# Patient Record
Sex: Male | Born: 1970 | Race: Black or African American | Hispanic: No | Marital: Married | Smoking: Never smoker
Health system: Southern US, Community
[De-identification: ages and names within clinical notes are randomized; demographics above are authoritative.]

## PROBLEM LIST (undated history)

## (undated) DIAGNOSIS — E291 Testicular hypofunction: Secondary | ICD-10-CM

## (undated) DIAGNOSIS — T7840XA Allergy, unspecified, initial encounter: Secondary | ICD-10-CM

## (undated) DIAGNOSIS — K219 Gastro-esophageal reflux disease without esophagitis: Secondary | ICD-10-CM

## (undated) DIAGNOSIS — G4733 Obstructive sleep apnea (adult) (pediatric): Secondary | ICD-10-CM

## (undated) DIAGNOSIS — J302 Other seasonal allergic rhinitis: Secondary | ICD-10-CM

## (undated) DIAGNOSIS — R12 Heartburn: Secondary | ICD-10-CM

## (undated) DIAGNOSIS — G4730 Sleep apnea, unspecified: Secondary | ICD-10-CM

## (undated) HISTORY — DX: Heartburn: R12

## (undated) HISTORY — DX: Gastro-esophageal reflux disease without esophagitis: K21.9

## (undated) HISTORY — PX: UPPER GASTROINTESTINAL ENDOSCOPY: SHX188

## (undated) HISTORY — DX: Sleep apnea, unspecified: G47.30

## (undated) HISTORY — DX: Allergy, unspecified, initial encounter: T78.40XA

## (undated) HISTORY — DX: Testicular hypofunction: E29.1

## (undated) HISTORY — PX: NO PAST SURGERIES: SHX2092

## (undated) HISTORY — PX: WISDOM TOOTH EXTRACTION: SHX21

## (undated) HISTORY — DX: Other seasonal allergic rhinitis: J30.2

## (undated) HISTORY — DX: Obstructive sleep apnea (adult) (pediatric): G47.33

---

## 2006-03-06 ENCOUNTER — Ambulatory Visit: Payer: Self-pay | Admitting: Family Medicine

## 2007-12-10 ENCOUNTER — Ambulatory Visit: Payer: Self-pay | Admitting: Internal Medicine

## 2007-12-10 DIAGNOSIS — Z8711 Personal history of peptic ulcer disease: Secondary | ICD-10-CM

## 2008-02-17 ENCOUNTER — Ambulatory Visit: Payer: Self-pay | Admitting: Internal Medicine

## 2008-02-22 LAB — CONVERTED CEMR LAB
ALT: 29 units/L (ref 0–53)
AST: 23 units/L (ref 0–37)
Alkaline Phosphatase: 53 units/L (ref 39–117)
Bilirubin, Direct: 0.2 mg/dL (ref 0.0–0.3)
CO2: 30 meq/L (ref 19–32)
Glucose, Bld: 92 mg/dL (ref 70–99)
HDL: 47.4 mg/dL (ref >39.0)
Hemoglobin: 15.8 g/dL (ref 13.0–17.0)
LDL Cholesterol: 86 mg/dL (ref 0–99)
Lymphocytes Relative: 26.3 % (ref 12.0–46.0)
Monocytes Relative: 6.5 % (ref 3.0–12.0)
Platelets: 153 10*3/uL (ref 150–400)
Potassium: 4 meq/L (ref 3.5–5.1)
RDW: 12.6 % (ref 11.5–14.6)
Sodium: 140 meq/L (ref 135–145)
Total CHOL/HDL Ratio: 3
Total Protein: 7.2 g/dL (ref 6.0–8.3)
Triglycerides: 46 mg/dL (ref 0–149)
WBC: 3.9 10*3/uL — ABNORMAL LOW (ref 4.5–10.5)

## 2008-06-20 ENCOUNTER — Ambulatory Visit: Payer: Self-pay | Admitting: Internal Medicine

## 2008-06-20 DIAGNOSIS — N410 Acute prostatitis: Secondary | ICD-10-CM

## 2008-06-20 LAB — CONVERTED CEMR LAB
Bilirubin Urine: NEGATIVE
Nitrite: NEGATIVE
Protein, U semiquant: 30
Urobilinogen, UA: 0.2
pH: 7

## 2008-08-16 ENCOUNTER — Telehealth (INDEPENDENT_AMBULATORY_CARE_PROVIDER_SITE_OTHER): Payer: Self-pay | Admitting: *Deleted

## 2008-08-18 ENCOUNTER — Ambulatory Visit: Payer: Self-pay | Admitting: Internal Medicine

## 2008-08-18 DIAGNOSIS — K219 Gastro-esophageal reflux disease without esophagitis: Secondary | ICD-10-CM

## 2008-08-18 LAB — CONVERTED CEMR LAB
Bilirubin Urine: NEGATIVE
Glucose, Urine, Semiquant: NEGATIVE
Protein, U semiquant: NEGATIVE
Specific Gravity, Urine: 1.02
WBC Urine, dipstick: NEGATIVE
pH: 6

## 2009-04-03 ENCOUNTER — Telehealth (INDEPENDENT_AMBULATORY_CARE_PROVIDER_SITE_OTHER): Payer: Self-pay | Admitting: *Deleted

## 2009-04-20 ENCOUNTER — Ambulatory Visit: Payer: Self-pay | Admitting: Internal Medicine

## 2009-04-24 LAB — CONVERTED CEMR LAB
BUN: 15 mg/dL (ref 6–23)
Basophils Absolute: 0 10*3/uL (ref 0.0–0.1)
CO2: 29 meq/L (ref 19–32)
Chloride: 104 meq/L (ref 96–112)
Eosinophils Absolute: 0.3 10*3/uL (ref 0.0–0.7)
Glucose, Bld: 91 mg/dL (ref 70–99)
HDL: 45.5 mg/dL (ref >39.00)
Hemoglobin: 16.3 g/dL (ref 13.0–17.0)
Lymphocytes Relative: 27.9 % (ref 12.0–46.0)
MCHC: 34 g/dL (ref 30.0–36.0)
Monocytes Relative: 5.2 % (ref 3.0–12.0)
Neutro Abs: 2.6 10*3/uL (ref 1.4–7.7)
Neutrophils Relative %: 58.9 % (ref 43.0–77.0)
Potassium: 4.4 meq/L (ref 3.5–5.1)
RBC: 5.32 M/uL (ref 4.22–5.81)
RDW: 12.3 % (ref 11.5–14.6)
Sodium: 140 meq/L (ref 135–145)
Triglycerides: 73 mg/dL (ref 0.0–149.0)
VLDL: 14.6 mg/dL (ref 0.0–40.0)

## 2009-06-08 ENCOUNTER — Ambulatory Visit: Payer: Self-pay | Admitting: Internal Medicine

## 2010-09-10 ENCOUNTER — Other Ambulatory Visit: Payer: Self-pay | Admitting: Internal Medicine

## 2010-09-10 ENCOUNTER — Ambulatory Visit (INDEPENDENT_AMBULATORY_CARE_PROVIDER_SITE_OTHER): Payer: BC Managed Care – PPO | Admitting: Internal Medicine

## 2010-09-10 ENCOUNTER — Ambulatory Visit (INDEPENDENT_AMBULATORY_CARE_PROVIDER_SITE_OTHER)
Admission: RE | Admit: 2010-09-10 | Discharge: 2010-09-10 | Disposition: A | Discharge status: Home / Self Care | Payer: BC Managed Care – PPO | Source: Ambulatory Visit | Attending: Internal Medicine | Admitting: Internal Medicine

## 2010-09-10 ENCOUNTER — Encounter: Payer: Self-pay | Admitting: Internal Medicine

## 2010-09-10 DIAGNOSIS — R0789 Other chest pain: Secondary | ICD-10-CM

## 2010-09-10 DIAGNOSIS — K219 Gastro-esophageal reflux disease without esophagitis: Secondary | ICD-10-CM

## 2010-09-11 ENCOUNTER — Encounter: Payer: Self-pay | Admitting: Gastroenterology

## 2010-09-17 NOTE — Assessment & Plan Note (Signed)
Summary: Stomach pain/ulcer flare up/cdj   Vital Signs:  Patient profile:   40 year old male Height:      70.75 inches Weight:      246.50 pounds BMI:     34.75 Pulse rate:   83 / minute Pulse rhythm:   regular BP sitting:   124 / 78  (left arm) Cuff size:   large  Vitals Entered By: Army Fossa CMA (September 10, 2010 1:10 PM) CC: Pt here to discuss pain from his Ulcer Comments Started yesterday [facility] [address] [facility]   History of Present Illness: developed a steady , shart pain at the R antero-lateral  chest wall yesterday while at the dentist no change w/ p.o. intake, worse w/ deep breaths  ,no radiation   No associated symptoms.  historically , this type of pain is associated with peptic ulcer disease. He has taken Prevacid x 1 since yesterday. No change in pain  ROS  denies fevers No cough , no shortness or breath but again has increased pain when he takes a deep breath  denies nausea, vomiting, diarrhea or blood in the stools Denies any leg swelling or pain in his calves, no recent airplane trip   Current Medications (verified): 1)  Prevacid 24hr 15 Mg Cpdr (Lansoprazole) .... As Needed  Allergies (verified): No Known Drug Allergies  Past History:  Past Medical History: Reviewed history from 08/18/2008 and no changes required.  GASTRIC ULCER, HX OF...documented by EGD --in the 1990s   GERD  Past Surgical History: Reviewed history from 06/20/2008 and no changes required. none  Family History: Reviewed history from 12/10/2007 and no changes required. CAD - no HTN - no DM - no stroke - no colon Ca - no prostate Ca - no M & F living -- good health  Social History: Reviewed history from 06/08/2009 and no changes required. Married 1  daughter born 2002 aprox Never Smoked Alcohol use-yes Drug use-no Occupation:   happy w/his job finished his  PhD exercisin 1-2/week diet--good , healthy   Physical Exam  General:  alert, well-developed, and  well-nourished.   no apparent distress Eyes:   not pale or jaundice Neck:   no lymphadenopathies, no crepitus Chest Wall:   slightly tender and they anterolateral chest wall , no rash or crepitus Lungs:  normal respiratory effort, no intercostal retractions, no accessory muscle use, and normal breath sounds.   no increased work of breathing Heart:  normal rate, regular rhythm, no murmur, and no gallop.   Abdomen:  soft, non-tender, no distention, no masses, no guarding, no rigidity, no hepatomegaly, and no splenomegaly.   specifically, no epigastric or right upper quadrant tenderness Extremities:   no edema,  calves symmetric   Impression & Recommendations:  Problem # 1:  CHEST PAIN, ATYPICAL (EAV-409.81) Assessment New 40 year old male, nonsmoker, no history of hypertension or high cholesterol  no family history of CAD presents with atypical chest pain. Chest   is likely pleuritic by description . On the  patient's own experience, this type of pain is usually associated with peptic ulcer disease. Plan: Chest x-ray EKG--no acute changes, no changes c/w  pericarditis continue with Prevacid ER if symptoms increase  Further workup if symptoms continue  Orders: T-2 View CXR (71020TC) EKG w/ Interpretation (93000)  Problem # 2:  GERD (ICD-530.81)  would like a GI referal , patient is concern about his h/o PUD and GERD symptoms , EGD?  His updated medication list for this problem includes:  Prevacid 24hr 15 Mg Cpdr (Lansoprazole) .Marland Kitchen... As needed  Orders: Gastroenterology Referral (GI)  Complete Medication List: 1)  Prevacid 24hr 15 Mg Cpdr (Lansoprazole) .... As needed  Patient Instructions: 1)  prevacid daily 2)  call if symptoms severe or no better in few days 3)  call if fever, short of breath, swelling on the legs    Orders Added: 1)  T-2 View CXR [71020TC] 2)  EKG w/ Interpretation [93000] 3)  Est. Patient Level IV [11914] 4)  Gastroenterology Referral [GI]

## 2010-09-17 NOTE — Letter (Signed)
Summary: New Patient letter  [facility]  [address] [place], [place] [address]   Phone: [phone]  Fax: [phone]       09/11/2010 MRN: [id number]  [facility] [address] [place], [place]  [address]  [place]  Dear Mr. Tyrone Freeman,  Welcome to the Gastroenterology Division at [facility].    You are scheduled to see Dr.  Jarold Motto on 10-15-10 at 1:30pm on the 3rd floor at [facility], [address]  We ask that you try to arrive at our office 15 minutes prior to your appointment time to allow for check-in.  We would like you to complete the enclosed self-administered evaluation form prior to your visit and bring it with you on the day of your appointment.  We will review it with you.  Also, please bring a complete list of all your medications or, if you prefer, bring the medication bottles and we will list them.  Please bring your insurance card so that we may make a copy of it.  If your insurance requires a referral to see a specialist, please bring your referral form from your primary care physician.  Co-payments are due at the time of your visit and may be paid by cash, check or credit card.     Your office visit will consist of a consult with your physician (includes a physical exam), any laboratory testing he/she may order, scheduling of any necessary diagnostic testing (e.g. x-ray, ultrasound, CT-scan), and scheduling of a procedure (e.g. Endoscopy, Colonoscopy) if required.  Please allow enough time on your schedule to allow for any/all of these possibilities.    If you cannot keep your appointment, please call [phone] to cancel or reschedule prior to your appointment date.  This allows [place] the opportunity to schedule an appointment for another patient in need of care.  If you do not cancel or reschedule by 5 p.m. the business day prior to your appointment date, you will be charged a $50.00 late cancellation/no-show fee.    Thank you for choosing  [facility] for your medical needs.  We appreciate the opportunity to care for you.  Please visit [place] at our website  to learn more about our practice.                     Sincerely,                                                             The Gastroenterology Division

## 2010-10-15 ENCOUNTER — Ambulatory Visit: Payer: BC Managed Care – PPO | Admitting: Gastroenterology

## 2011-06-19 ENCOUNTER — Encounter: Payer: Self-pay | Admitting: Internal Medicine

## 2011-06-19 ENCOUNTER — Ambulatory Visit (INDEPENDENT_AMBULATORY_CARE_PROVIDER_SITE_OTHER): Payer: BC Managed Care – PPO | Admitting: Internal Medicine

## 2011-06-19 VITALS — BP 110/82 | HR 65 | Temp 98.3°F | Ht 71.5 in | Wt 252.0 lb

## 2011-06-19 DIAGNOSIS — R39198 Other difficulties with micturition: Secondary | ICD-10-CM

## 2011-06-19 DIAGNOSIS — R300 Dysuria: Secondary | ICD-10-CM | POA: Insufficient documentation

## 2011-06-19 DIAGNOSIS — R3989 Other symptoms and signs involving the genitourinary system: Secondary | ICD-10-CM

## 2011-06-19 LAB — POCT URINALYSIS DIPSTICK
Bilirubin, UA: NEGATIVE
Ketones, UA: NEGATIVE
Protein, UA: NEGATIVE
Spec Grav, UA: 1.015
pH, UA: 5

## 2011-06-19 MED ORDER — CIPROFLOXACIN HCL 500 MG PO TABS: 500.0000 mg | ORAL_TABLET | Freq: Two times a day (BID) | ORAL | Status: AC

## 2011-06-19 NOTE — Progress Notes (Signed)
  Subjective:    Patient ID: Tyrone Freeman, male    DOB: 1970/09/03, 40 y.o.   MRN: [id number]  HPI Sx started 2 days ago: dysuria w/o other sx such as frecuency or difficulty urinating. Has not taken any meds, no h/o UTIs or STDs. "drinking more sodas than usual"   Past Medical History: GASTRIC ULCER, HX OF...documented by EGD --in the 1990s   GERD  Past Surgical History: none  Family History: CAD - no HTN - no DM - no stroke - no colon Ca - no prostate Ca - no M & F living -- good health  Social History: Married, 1  daughter born 2002 aprox Never Smoked Alcohol use-yes Drug use-no Occupation:  Warehouse manager, has a  PhD    Review of Systems No F/C No N V D No genital rash, no  high risk behavior, no penile d/c. No flank pain    Objective:   Physical Exam  Constitutional: He is oriented to person, place, and time. He appears well-developed. No distress.  Abdominal: Soft. He exhibits no distension. There is no tenderness. There is no rebound and no guarding.       No CVA tenderness   Genitourinary:       Genital-- normal to inspection and palpation, no genital rash or ulcer, testicles normal. DRE: quite limited , hard to reach the prostate but it was not tender   Musculoskeletal: He exhibits no edema.  Neurological: He is alert and oriented to person, place, and time.  Skin: Skin is warm and dry. He is not diaphoretic.  Psychiatric: He has a normal mood and affect. His behavior is normal. Judgment and thought content normal.      Assessment & Plan:

## 2011-06-19 NOTE — Patient Instructions (Signed)
Drink plenty of fluids cipro as prescribed  Call if you get worse or no better in few days

## 2011-06-19 NOTE — Assessment & Plan Note (Signed)
prssent w/ dysuria, ROS essentially negative, exam normal. On chart review, in 2009 had symilar sx Plan: Udip neg UCX, G&C Empiric cipro for now

## 2011-06-20 LAB — GC/CHLAMYDIA PROBE AMP, URINE
Chlamydia, Swab/Urine, PCR: NEGATIVE
GC Probe Amp, Urine: NEGATIVE

## 2011-06-21 LAB — URINE CULTURE: Colony Count: NO GROWTH

## 2012-05-21 ENCOUNTER — Ambulatory Visit (INDEPENDENT_AMBULATORY_CARE_PROVIDER_SITE_OTHER): Payer: BC Managed Care – PPO | Admitting: Internal Medicine

## 2012-05-21 VITALS — BP 118/82 | HR 66 | Temp 98.1°F | Wt 252.0 lb

## 2012-05-21 DIAGNOSIS — J309 Allergic rhinitis, unspecified: Secondary | ICD-10-CM

## 2012-05-21 DIAGNOSIS — J302 Other seasonal allergic rhinitis: Secondary | ICD-10-CM

## 2012-05-21 DIAGNOSIS — R10A0 Flank pain, unspecified side: Secondary | ICD-10-CM

## 2012-05-21 DIAGNOSIS — R109 Unspecified abdominal pain: Secondary | ICD-10-CM

## 2012-05-21 MED ORDER — SULFAMETHOXAZOLE-TRIMETHOPRIM 800-160 MG PO TABS: 1.0000 | ORAL_TABLET | Freq: Two times a day (BID) | ORAL | Status: DC

## 2012-05-21 MED ORDER — FLUTICASONE PROPIONATE 50 MCG/ACT NA SUSP: 2.0000 | Freq: Every day | NASAL | Status: DC

## 2012-05-21 NOTE — Progress Notes (Signed)
  Subjective:    Patient ID: Tyrone Freeman, male    DOB: 07-26-1970, 41 y.o.   MRN: [id number]  HPI Acute visit Several weeks history of upper respiratory symptoms: Nasal congestion, worse in the morning and late at night, cough with yellow sputum. He has used Nasonex, Advil, Sudafed. At this point the cough is resolved, nasal congestion persist. Patient thinks it may be allergies.  Past Medical History  Diagnosis Date  . Gastric ulcer   . Esophageal reflux   . Seasonal allergies 05/23/2012   No past surgical history on file.    Review of Systems Admits to sneezing on and off throughout the day, some clear nasal discharge   as well. Occasional sore throat, no ear ache. Denies itchy or watery eyes.     Objective:   Physical Exam General -- alert, well-developed, and overweight appearing. No apparent distress.  HEENT -- TMs slt bulge but no red, throat w/o redness, face symmetric and not tender to palpation, nose slt  Congested  Lungs -- normal respiratory effort, no intercostal retractions, no accessory muscle use, and normal breath sounds.   Heart-- normal rate, regular rhythm, no murmur, and no gallop.   Psych-- Cognition and judgment appear intact. Alert and cooperative with normal attention span and concentration.  not anxious appearing and not depressed appearing.       Assessment & Plan:

## 2012-05-21 NOTE — Patient Instructions (Addendum)
Take Flonase consistent every night for at least 4-6 weeks Takes Allegra twice a day also consistently If not improving in the next few weeks let me know --- Schedule a checkup at your convenience

## 2012-05-23 ENCOUNTER — Encounter: Payer: Self-pay | Admitting: Internal Medicine

## 2012-05-23 DIAGNOSIS — J302 Other seasonal allergic rhinitis: Secondary | ICD-10-CM

## 2012-05-23 HISTORY — DX: Other seasonal allergic rhinitis: J30.2

## 2012-05-23 NOTE — Assessment & Plan Note (Signed)
Likely seasonal allergies, in the past Zyrtec and Claritin didn't work. Plan: Allegra, consistent use of Flonase, see instructions

## 2012-07-19 ENCOUNTER — Ambulatory Visit (INDEPENDENT_AMBULATORY_CARE_PROVIDER_SITE_OTHER): Payer: BC Managed Care – PPO | Admitting: Internal Medicine

## 2012-07-19 ENCOUNTER — Encounter: Payer: Self-pay | Admitting: Internal Medicine

## 2012-07-19 VITALS — BP 122/80 | HR 94 | Temp 99.0°F | Wt 251.0 lb

## 2012-07-19 DIAGNOSIS — J302 Other seasonal allergic rhinitis: Secondary | ICD-10-CM

## 2012-07-19 DIAGNOSIS — E291 Testicular hypofunction: Secondary | ICD-10-CM

## 2012-07-19 DIAGNOSIS — J309 Allergic rhinitis, unspecified: Secondary | ICD-10-CM

## 2012-07-19 DIAGNOSIS — N529 Male erectile dysfunction, unspecified: Secondary | ICD-10-CM

## 2012-07-19 MED ORDER — FLUTICASONE PROPIONATE 50 MCG/ACT NA SUSP: 2.0000 | Freq: Every day | NASAL | Status: DC

## 2012-07-19 NOTE — Progress Notes (Signed)
  Subjective:    Patient ID: Emmanuelle Coxe, male    DOB: [birth year], 42 y.o.   MRN: [id number]  HPI ROV Allergies, not well controlled, despite medications; continue with runny nose, feels ears are stopped up. See assessment and plan.  Also, for the last 2 or 3 weeks has noted a decrease in his spontaneous erections and decrease in the quality of erections. Check testosterone?  Past Medical History  Diagnosis Date  . Gastric ulcer   . Esophageal reflux   . Seasonal allergies 05/23/2012   Past Surgical History  Procedure Date  . No past surgeries      Review of Systems At this point he denies any postnasal dripping, itchy eyes. He does have itchy nose and sneezing quite a bit. Denies anxiety, depression; relationship with wife is excellent. No recent changes in his weight. He remains active, denies claudication     Objective:   Physical Exam  General -- alert, well-developed HEENT -- TMs normal, throat w/o redness, face symmetric and not tender to palpation, nose is slightly congested  Lungs -- normal respiratory effort, no intercostal retractions, no accessory muscle use, and normal breath sounds.   Heart-- normal rate, regular rhythm, no murmur, and no gallop.   Extremities-- no pretibial edema bilaterally; normal femoral pulses bilaterally Neurologic-- alert & oriented X3 and strength normal in all extremities. Psych-- Cognition and judgment appear intact. Alert and cooperative with normal attention span and concentration.  not anxious appearing and not depressed appearing.      Assessment & Plan:

## 2012-07-19 NOTE — Assessment & Plan Note (Signed)
Since the last time I saw him, he used Flonase in the work very well for him but he didn't like to take it long term. Currently taking Allegra D. OTC every day. Plan: Continue with Allegra but without the "D" , explained patient that is not appropriate for  long-term use . Explained that he may have a transient increase in symptoms since he is stopping decongestants. Restart Flonase, that is safe for long-term. If not completely satisfied with treatment he will call for a referral

## 2012-07-19 NOTE — Assessment & Plan Note (Signed)
Decreased erections in the last couple of weeks, unclear etiology, vascular exam normal. He request a testosterone checked, will do

## 2012-07-20 LAB — TESTOSTERONE, FREE, TOTAL, SHBG
Testosterone, Free: 46.7 pg/mL — ABNORMAL LOW (ref 47.0–244.0)
Testosterone-% Free: 2.9 % (ref 1.6–2.9)
Testosterone: 161.82 ng/dL — ABNORMAL LOW (ref 300–890)

## 2012-07-26 ENCOUNTER — Telehealth: Payer: Self-pay | Admitting: *Deleted

## 2012-07-26 DIAGNOSIS — E291 Testicular hypofunction: Secondary | ICD-10-CM

## 2012-07-26 NOTE — Telephone Encounter (Signed)
Discussed with pt, he is coming in the morning to have his labs redrawn. Orders entered.

## 2012-07-26 NOTE — Telephone Encounter (Signed)
Testosterone was indeed low, we need to check other labs; we tried to contact him but couldn't. Please arrange FSH, LH, prolactin --- dx hypogonadism Test to be done in the morning

## 2012-07-26 NOTE — Telephone Encounter (Signed)
Patient called triage inquiring about test results from recent visit. Patients Testosterone level low at 161.80 with the Free Testosterone 46.7, please advise and I will contact patient. Thanks

## 2012-07-27 ENCOUNTER — Other Ambulatory Visit (INDEPENDENT_AMBULATORY_CARE_PROVIDER_SITE_OTHER): Payer: BC Managed Care – PPO

## 2012-07-27 DIAGNOSIS — E291 Testicular hypofunction: Secondary | ICD-10-CM

## 2012-07-27 LAB — FOLLICLE STIMULATING HORMONE: FSH: 4.8 m[IU]/mL (ref 1.4–18.1)

## 2012-07-29 NOTE — Addendum Note (Signed)
Addended by: Edwena Felty T on: 07/29/2012 03:11 PM   Modules accepted: Orders

## 2012-08-04 ENCOUNTER — Ambulatory Visit (INDEPENDENT_AMBULATORY_CARE_PROVIDER_SITE_OTHER): Payer: BC Managed Care – PPO | Admitting: Endocrinology

## 2012-08-04 VITALS — BP 130/78 | HR 90 | Wt 257.0 lb

## 2012-08-04 DIAGNOSIS — E291 Testicular hypofunction: Secondary | ICD-10-CM | POA: Insufficient documentation

## 2012-08-04 DIAGNOSIS — Z0000 Encounter for general adult medical examination without abnormal findings: Secondary | ICD-10-CM | POA: Insufficient documentation

## 2012-08-04 LAB — BASIC METABOLIC PANEL
BUN: 15 mg/dL (ref 6–23)
Chloride: 106 mEq/L (ref 96–112)
Glucose, Bld: 83 mg/dL (ref 70–99)
Potassium: 3.9 mEq/L (ref 3.5–5.1)

## 2012-08-04 MED ORDER — CLOMIPHENE CITRATE 50 MG PO TABS: ORAL_TABLET | ORAL | Status: DC

## 2012-08-04 NOTE — Patient Instructions (Addendum)
blood tests are being requested for you today.  We'll contact you with results. normalization of testosterone is not known to harm you.  however, there are "theoretical" risks, including changes in fertility, hair loss, prostate cancer, benign prostate enlargement, blood clots, liver problems, lower hdl ("good cholesterol"), sleep apnea, and behavior changes.  Based on the results, i hope to prescribe for you a pill to help the testosterone.

## 2012-08-04 NOTE — Progress Notes (Signed)
Subjective:    Patient ID: Tyrone Freeman, male    DOB: Sep 25, 1970, 42 y.o.   MRN: [id number]  HPI Pt reports normal puberty onset.  He has 1 child, and wishes to preserve his fertility.  He has no h/o infertility.  He has no h/o head or testicular injury.  He has never been treated with androgens.  Pt states 4 weeks of slight dizziness sensation in the head, and assoc ED sxs. Past Medical History  Diagnosis Date  . Gastric ulcer   . Esophageal reflux   . Seasonal allergies 05/23/2012    Past Surgical History  Procedure Date  . No past surgeries     History   Social History  . Marital Status: Married    Spouse Name: N/A    Number of Children: 1  . Years of Education: N/A   Occupational History  . Production designer, theatre/television/film . College Public house manager    Social History Main Topics  . Smoking status: Never Smoker   . Smokeless tobacco: Never Used  . Alcohol Use: Yes     Comment: rarely   . Drug Use: No  . Sexually Active: Not on file   Other Topics Concern  . Not on file   Social History Narrative  . No narrative on file    Current Outpatient Prescriptions on File Prior to Visit  Medication Sig Dispense Refill  . fexofenadine (ALLEGRA) 60 MG tablet Take 60 mg by mouth 2 (two) times daily.      . fluticasone (FLONASE) 50 MCG/ACT nasal spray Place 2 sprays into the nose daily.  16 g  6  . omeprazole (PRILOSEC OTC) 20 MG tablet Take 20 mg by mouth daily.          Allergies  Allergen Reactions  . Hydrocodone     Nausea, no rash or itching    No family history on file. Neg for hypogonadism or infertility BP 130/78  Pulse 90  Wt 257 lb (116.574 kg)  SpO2 97%  Review of Systems He reports generalized weakness.  denies polyuria, syncope, rash, depression, headache, visual loss, galactorrhea, weight change, easy bruising, change in facial appearance, and n/v.  He attributes rhinorrhea to allergic rhinitis.      Objective:   Physical Exam VS: see vs page GEN: no distress HEAD: head:  no deformity eyes: no periorbital swelling, no proptosis external nose and ears are. mouth: no lesion seen.   NECK: supple, thyroid is not enlarged. CHEST WALL: no deformity. LUNGS: clear to auscultation. BREASTS:  No gynecomastia. CV: reg rate and rhythm, no murmur.  ABD: abdomen is soft, nontender.  no hepatosplenomegaly.  not distended.  no hernia GENITALIA:  Normal male.   MUSCULOSKELETAL: muscle bulk and strength are grossly normal.  no obvious joint swelling.  gait is normal and steady EXTEMITIES: no deformity.  no edema PULSES: dorsalis pedis intact bilat.  no carotid bruit NEURO:  cn 2-12 grossly intact.   readily moves all 4's.  sensation is intact to touch on the feet.   SKIN:  Normal texture and temperature.  No rash or suspicious lesion is visible.  Normal hair distribution. NODES:  None palpable at the neck. PSYCH: alert, oriented x3.  Does not appear anxious nor depressed.   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  i don't think this testosterone level is low enough to warrant pituitary MRI ED sxs.  Uncertain to what extent this is related to hypogonadism. Generalized weakness.  This has a highly variable response to normalization of testosterone level.

## 2012-08-14 ENCOUNTER — Other Ambulatory Visit: Payer: Self-pay

## 2012-08-23 ENCOUNTER — Encounter: Payer: Self-pay | Admitting: Endocrinology

## 2012-09-24 ENCOUNTER — Ambulatory Visit (INDEPENDENT_AMBULATORY_CARE_PROVIDER_SITE_OTHER): Payer: BC Managed Care – PPO | Admitting: Internal Medicine

## 2012-09-24 ENCOUNTER — Encounter: Payer: Self-pay | Admitting: Internal Medicine

## 2012-09-24 VITALS — BP 126/74 | HR 62 | Wt 252.0 lb

## 2012-09-24 DIAGNOSIS — E291 Testicular hypofunction: Secondary | ICD-10-CM

## 2012-09-24 NOTE — Progress Notes (Signed)
  Subjective:    Patient ID: Tyrone Freeman, male    DOB: 1971/03/03, 42 y.o.   MRN: [id number]  HPI Followup hypogonadism. Status post endocrinology eval, note reviewed, see assessment and plan. Took Clomid for one week, has no side effects but decided to stop and see how he is doing now.  Past Medical History  Diagnosis Date  . Gastric ulcer   . Esophageal reflux   . Seasonal allergies 05/23/2012   Past Surgical History  Procedure Laterality Date  . No past surgeries        Review of Systems History of ED, symptoms essentially resolved. History of fatigue, symptoms resolve as well    Objective:   Physical Exam General -- alert, well-developed Neurologic-- alert & oriented X3 and strength normal in all extremities. Psych-- Cognition and judgment appear intact. Alert and cooperative with normal attention span and concentration.  not anxious appearing and not depressed appearing.       Assessment & Plan:

## 2012-09-24 NOTE — Assessment & Plan Note (Addendum)
Mild, central hypogonadism, saw endocrinology, they recommended possibly Clomid although his testosterone when rechecked was increasing. Level of testosterone did not warrant a MRI. The patient took Clomid for one week, no side effects but decided to discontinue and likes to see how his  testosterone is. Currently asymptomatic Plan: Check testosterone.

## 2012-09-24 NOTE — Patient Instructions (Addendum)
Schedule a physical at your earliest convenience  

## 2012-09-27 LAB — TESTOSTERONE, FREE, TOTAL, SHBG: Testosterone-% Free: 2.8 % (ref 1.6–2.9)

## 2013-01-03 ENCOUNTER — Encounter: Payer: Self-pay | Admitting: Internal Medicine

## 2013-05-05 ENCOUNTER — Other Ambulatory Visit: Payer: Self-pay

## 2013-07-25 ENCOUNTER — Telehealth: Payer: Self-pay

## 2013-07-25 NOTE — Telephone Encounter (Signed)
Left message for call back Non identifiable  

## 2013-07-26 ENCOUNTER — Ambulatory Visit (INDEPENDENT_AMBULATORY_CARE_PROVIDER_SITE_OTHER): Payer: BC Managed Care – PPO | Admitting: Internal Medicine

## 2013-07-26 ENCOUNTER — Encounter: Payer: Self-pay | Admitting: Internal Medicine

## 2013-07-26 VITALS — BP 116/76 | HR 67 | Temp 98.3°F | Ht 70.5 in | Wt 232.0 lb

## 2013-07-26 DIAGNOSIS — Z0000 Encounter for general adult medical examination without abnormal findings: Secondary | ICD-10-CM

## 2013-07-26 DIAGNOSIS — E291 Testicular hypofunction: Secondary | ICD-10-CM

## 2013-07-26 NOTE — Progress Notes (Signed)
Pre visit review using our clinic review tool, if applicable. No additional management support is needed unless otherwise documented below in the visit note. 

## 2013-07-26 NOTE — Patient Instructions (Signed)
please schedule labs: FLP, CMP, CBC, TSH--- dx v70 Free  and total testosterone hypogonadism   Next visit is for a physical exam in 1 year fasting Please make an appointment    Ear pain: Motrin 200 mg 2 tablets every 6 hours as needed for pain. Always take it with food because may cause gastritis and ulcers. If you notice nausea, stomach pain, change in the color of stools --->  Stop the medicine and let us know  Use OTC Nasocort: 2 nasal sprays on each side of the nose daily until you feel better  Call if no better in 2-3 weeks     Testicular Self-Exam A self-examination of your testicles involves looking at and feeling your testicles for abnormal lumps or swelling. Several things can cause swelling, lumps, or pain in your testicles. Some of these causes are:  Injuries.  Inflammation.  Infection.  Accumulation of fluids around your testicle (hydrocele).  Twisted testicles (testicular torsion).  Testicular cancer. Self-examination of the testicles and groin areas may be advised if you are at risk for testicular cancer. Risks for testicular cancer include:  An undescended testicle (cryptorchidism).  A history of previous testicular cancer.  A family history of testicular cancer. The testicles are easiest to examine after warm baths or showers and are more difficult to examine when you are cold. This is because the muscles attached to the testicles retract and pull them up higher or into the abdomen. Follow these steps while you are standing:  Hold your penis away from your body.  Roll one testicle between your thumb and forefinger, feeling the entire testicle.  Roll the other testicle between your thumb and forefinger, feeling the entire testicle. Feel for lumps, swelling, or discomfort. A normal testicle is egg shaped and feels firm. It is smooth and not tender. The spermatic cord can be felt as a firm spaghetti-like cord at the back of your testicle. It is also  important to examine the crease between the front of your leg and your abdomen. Feel for any bumps that are tender. These could be enlarged lymph nodes.  Document Released: 09/22/2000 Document Revised: 02/16/2013 Document Reviewed: 12/06/2012 [facility] Patient Information 2014 [place], [place].

## 2013-07-26 NOTE — Progress Notes (Signed)
   Subjective:    Patient ID: Tyrone Freeman, male    DOB: 1970-07-10, 43 y.o.   MRN: [id number]  HPI Here for a CPX Also complaining of right ear ache, 3 weeks, went to see his dentist, no etiology found but he was prescribed antibiotics. Pain increase w/ chewing Symptoms started 2 weeks after he had URI, now he has no URI symptoms.  Past Medical History  Diagnosis Date  . Gastric ulcer   . Esophageal reflux   . Seasonal allergies 05/23/2012  . Hypogonadism male    Past Surgical History  Procedure Laterality Date  . No past surgeries     History   Social History  . Marital Status: Married    Spouse Name: N/A    Number of Children: 1  . Years of Education: N/A   Occupational History  . 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 Smoking status: Never Smoker   . Smokeless tobacco: Never Used  . Alcohol Use: Yes     Comment: rarely   . Drug Use: No  . Sexual Activity: Not on file   Other Topics Concern  . Not on file   Social History Narrative  . No narrative on file   Family History  Problem Relation Age of Onset  . CAD Neg Hx   . Diabetes Neg Hx   . Colon cancer Neg Hx   . Prostate cancer Other     GF dx in his 7280    Review of Systems Diet, Exercise-- doing great, lost 20 pounds No  CP, SOB Denies  nausea, vomiting diarrhea Denies  blood in the stools No GERD  Sx on PPIs No dysuria, gross hematuria, difficulty urinating   + stress but doing well no anxiety, depression Libido normal       Objective:   Physical Exam  BP 116/76  Pulse 67  Temp(Src) 98.3 F (36.8 C)  Ht 5' 10.5" (1.791 m)  Wt 232 lb (105.235 kg)  BMI 32.81 kg/m2  SpO2 99% General -- alert, well-developed, NAD.  Neck --no thyromegaly ,  no LAD HEENT-- Not pale. TMs slt bulge but no red B TMJ-- no click, mild pain on the R Throat symmetric, no redness or discharge. Face symmetric, sinuses not tender to palpation. Nose not congested. Lungs -- normal  respiratory effort, no intercostal retractions, no accessory muscle use, and normal breath sounds.  Heart-- normal rate, regular rhythm, no murmur.  Abdomen-- Not distended, good bowel sounds,soft, non-tender.  Extremities-- no pretibial edema bilaterally  Neurologic--  alert & oriented X3. Speech normal, gait normal, strength normal in all extremities.  Psych-- Cognition and judgment appear intact. Cooperative with normal attention span and concentration. No anxious or depressed appearing.     Assessment & Plan:  Ear pain-- TMJ? Serous otitis? See instructions

## 2013-07-26 NOTE — Assessment & Plan Note (Signed)
On no meds, feels great, labs

## 2013-07-26 NOTE — Assessment & Plan Note (Addendum)
Td 07 Flu shot declined  cscope-- never   Diet-exercise -- doing great! Lost weight, not snoring as before, has more energy Labs

## 2013-07-27 ENCOUNTER — Encounter: Payer: Self-pay | Admitting: Internal Medicine

## 2013-07-27 ENCOUNTER — Other Ambulatory Visit (INDEPENDENT_AMBULATORY_CARE_PROVIDER_SITE_OTHER): Payer: BC Managed Care – PPO

## 2013-07-27 DIAGNOSIS — Z0000 Encounter for general adult medical examination without abnormal findings: Secondary | ICD-10-CM

## 2013-07-27 NOTE — Telephone Encounter (Signed)
Unable to reach prior to visit  

## 2013-07-28 ENCOUNTER — Encounter: Payer: Self-pay | Admitting: Internal Medicine

## 2013-07-28 LAB — CBC WITH DIFFERENTIAL/PLATELET
BASOS ABS: 0 10*3/uL (ref 0.0–0.1)
Basophils Relative: 0.3 % (ref 0.0–3.0)
Eosinophils Absolute: 0.4 10*3/uL (ref 0.0–0.7)
Eosinophils Relative: 8 % — ABNORMAL HIGH (ref 0.0–5.0)
HEMATOCRIT: 47.1 % (ref 39.0–52.0)
HEMOGLOBIN: 15.4 g/dL (ref 13.0–17.0)
LYMPHS ABS: 1.5 10*3/uL (ref 0.7–4.0)
LYMPHS PCT: 31.7 % (ref 12.0–46.0)
MCHC: 32.7 g/dL (ref 30.0–36.0)
MCV: 91.7 fl (ref 78.0–100.0)
Monocytes Absolute: 0.3 10*3/uL (ref 0.1–1.0)
Monocytes Relative: 5.6 % (ref 3.0–12.0)
NEUTROS ABS: 2.5 10*3/uL (ref 1.4–7.7)
Neutrophils Relative %: 54.4 % (ref 43.0–77.0)
Platelets: 171 10*3/uL (ref 150.0–400.0)
RBC: 5.13 Mil/uL (ref 4.22–5.81)
RDW: 13.6 % (ref 11.5–14.6)
WBC: 4.6 10*3/uL (ref 4.5–10.5)

## 2013-07-28 LAB — COMPREHENSIVE METABOLIC PANEL
ALT: 25 U/L (ref 0–53)
AST: 27 U/L (ref 0–37)
Albumin: 3.8 g/dL (ref 3.5–5.2)
Alkaline Phosphatase: 59 U/L (ref 39–117)
BUN: 16 mg/dL (ref 6–23)
CALCIUM: 9.2 mg/dL (ref 8.4–10.5)
CO2: 26 mEq/L (ref 19–32)
CREATININE: 1.4 mg/dL (ref 0.4–1.5)
Chloride: 106 mEq/L (ref 96–112)
GFR: 69.03 mL/min (ref >60.00)
Glucose, Bld: 67 mg/dL — ABNORMAL LOW (ref 70–99)
Potassium: 3.9 mEq/L (ref 3.5–5.1)
Sodium: 139 mEq/L (ref 135–145)
TOTAL PROTEIN: 7.1 g/dL (ref 6.0–8.3)
Total Bilirubin: 0.7 mg/dL (ref 0.3–1.2)

## 2013-07-28 LAB — LIPID PANEL
CHOL/HDL RATIO: 3
Cholesterol: 150 mg/dL (ref 0–200)
HDL: 48.3 mg/dL (ref >39.00)
LDL CALC: 91 mg/dL (ref 0–99)
TRIGLYCERIDES: 56 mg/dL (ref 0.0–149.0)
VLDL: 11.2 mg/dL (ref 0.0–40.0)

## 2013-07-28 LAB — TSH: TSH: 0.89 u[IU]/mL (ref 0.35–5.50)

## 2013-08-02 ENCOUNTER — Other Ambulatory Visit (INDEPENDENT_AMBULATORY_CARE_PROVIDER_SITE_OTHER): Payer: BC Managed Care – PPO

## 2013-08-02 ENCOUNTER — Other Ambulatory Visit: Payer: Self-pay | Admitting: Internal Medicine

## 2013-08-02 DIAGNOSIS — Z0000 Encounter for general adult medical examination without abnormal findings: Secondary | ICD-10-CM

## 2013-08-03 LAB — FOLATE: Folate: 8.3 ng/mL (ref >5.9)

## 2013-08-03 LAB — VITAMIN B12: Vitamin B-12: 402 pg/mL (ref 211–911)

## 2013-08-05 LAB — VITAMIN D 1,25 DIHYDROXY
Vitamin D 1, 25 (OH)2 Total: 72 pg/mL (ref 18–72)
Vitamin D2 1, 25 (OH)2: <8 pg/mL
Vitamin D3 1, 25 (OH)2: 72 pg/mL

## 2013-08-05 LAB — TESTOSTERONE, FREE, TOTAL, SHBG
SEX HORMONE BINDING: 16 nmol/L (ref 13–71)
TESTOSTERONE-% FREE: 2.7 % (ref 1.6–2.9)
TESTOSTERONE: 250 ng/dL — AB (ref 300–890)
Testosterone, Free: 68.7 pg/mL (ref 47.0–244.0)

## 2013-11-08 ENCOUNTER — Other Ambulatory Visit: Payer: Self-pay | Admitting: Endocrinology

## 2013-11-09 NOTE — Telephone Encounter (Signed)
Please refill x 1 Ov is due  

## 2013-11-09 NOTE — Telephone Encounter (Signed)
Medication refilled

## 2014-08-22 ENCOUNTER — Ambulatory Visit (INDEPENDENT_AMBULATORY_CARE_PROVIDER_SITE_OTHER): Payer: BC Managed Care – PPO | Admitting: Internal Medicine

## 2014-08-22 ENCOUNTER — Encounter: Payer: Self-pay | Admitting: Internal Medicine

## 2014-08-22 VITALS — BP 119/79 | HR 72 | Temp 98.0°F | Ht 71.0 in | Wt 234.1 lb

## 2014-08-22 DIAGNOSIS — Z0000 Encounter for general adult medical examination without abnormal findings: Secondary | ICD-10-CM

## 2014-08-22 NOTE — Progress Notes (Signed)
Pre visit review using our clinic review tool, if applicable. No additional management support is needed unless otherwise documented below in the visit note. 

## 2014-08-22 NOTE — Assessment & Plan Note (Addendum)
Td 07 cscope-- never   Diet-exercise -- doing great!  -History of low T, last time it was check, free testosterone was normal, he feels great, no need to recheck a testosterone level. -No early family history of prostate cancer, we'll start that conversation when he is around [age] -Labs  - counseled, STE - right ear discomfort? Exam negative, recommend observation F/u 1 year

## 2014-08-22 NOTE — Patient Instructions (Signed)
  Please schedule labs to be done within few days (fasting)  Come back in 1 year  for a  office visit physical exam   Come back fasting    Testicular Self-Exam A self-examination of your testicles involves looking at and feeling your testicles for abnormal lumps or swelling. Several things can cause swelling, lumps, or pain in your testicles. Some of these causes are:  Injuries.  Inflammation.  Infection.  Accumulation of fluids around your testicle (hydrocele).  Twisted testicles (testicular torsion).  Testicular cancer. Self-examination of the testicles and groin areas may be advised if you are at risk for testicular cancer. Risks for testicular cancer include:  An undescended testicle (cryptorchidism).  A history of previous testicular cancer.  A family history of testicular cancer. The testicles are easiest to examine after warm baths or showers and are more difficult to examine when you are cold. This is because the muscles attached to the testicles retract and pull them up higher or into the abdomen. Follow these steps while you are standing:  Hold your penis away from your body.  Roll one testicle between your thumb and forefinger, feeling the entire testicle.  Roll the other testicle between your thumb and forefinger, feeling the entire testicle. Feel for lumps, swelling, or discomfort. A normal testicle is egg shaped and feels firm. It is smooth and not tender. The spermatic cord can be felt as a firm spaghetti-like cord at the back of your testicle. It is also important to examine the crease between the front of your leg and your abdomen. Feel for any bumps that are tender. These could be enlarged lymph nodes.  Document Released: 09/22/2000 Document Revised: 02/16/2013 Document Reviewed: 12/06/2012 [facility] Patient Information 2015 [place], [place]. This information is not intended to replace advice given to you by your health care provider. Make sure you discuss any  questions you have with your health care provider.

## 2014-08-22 NOTE — Progress Notes (Signed)
Subjective:    Patient ID: Tyrone Freeman, male    DOB: [birth year], 44 y.o.   MRN: [id number]  DOS:  08/22/2014 Type of visit - description : cpx Interval history:  feeling great, eating healthy, very active, every few months runs half marathon or a 10K.    Review of Systems  occasional roblems in the right ear, no pain but has  "a sensation" when he puts a ear piece. No ear discharge or bleeding Otherwise review of system is essentially negative Constitutional: No fever, chills. No unexplained wt changes. No unusual sweats HEENT: No dental problems, ear discharge, facial swelling, voice changes. No eye discharge, redness or intolerance to light Respiratory: No wheezing or difficulty breathing. No cough , mucus production Cardiovascular: No CP, leg swelling or palpitations GI: no nausea, vomiting, diarrhea or abdominal pain.  No blood in the stools. No dysphagia   Endocrine: No polyphagia, polyuria or polydipsia GU: No dysuria, gross hematuria, difficulty urinating. No urinary urgency or frequency. Musculoskeletal: No joint swellings or unusual aches or pains Skin: No change in the color of the skin, palor or rash Allergic, immunologic: No environmental allergies or food allergies Neurological: No dizziness or syncope. No headaches. No diplopia, slurred speech, motor deficits, facial numbness Hematological: No enlarged lymph nodes, easy bruising or bleeding Psychiatry: No suicidal ideas, hallucinations, behavior problems or confusion. No unusual/severe anxiety or depression.     Past Medical History  Diagnosis Date  . Gastric ulcer   . Esophageal reflux   . Seasonal allergies 05/23/2012  . Hypogonadism male     Past Surgical History  Procedure Laterality Date  . No past surgeries      History   Social History  . Marital Status: Married    Spouse Name: N/A  . Number of Children: 1  . 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 Smoking status: Never Smoker   . Smokeless tobacco: Never Used  . Alcohol Use: Yes     Comment: rarely   . Drug Use: No  . Sexual Activity: Not on file   Other Topics Concern  . Not on file   Social History Narrative   Household-- pt, wife , daughter 44 y/o     Family History  Problem Relation Age of Onset  . CAD Neg Hx   . Diabetes Neg Hx   . Colon cancer Neg Hx   . Prostate cancer Other     GF dx in his [age]       Medication List       This list is accurate as of: 08/22/14 11:59 PM.  Always use your most recent med list.               PRILOSEC OTC 20 MG tablet  Generic drug:  omeprazole  Take 20 mg by mouth daily as needed.           Objective:   Physical Exam BP 119/79 mmHg  Pulse 72  Temp(Src) 98 F (36.7 C) (Oral)  Ht 5\' 11"  (1.803 m)  Wt 234 lb 2 oz (106.198 kg)  BMI 32.67 kg/m2  SpO2 100% General:   Well developed, well nourished . NAD.  Neck:  Full range of motion. Supple. No  thyromegaly , normal carotid pulse, no LAD. HEENT:  Normocephalic . Face symmetric, atraumatic  ears: Canals and tympanic membranes normal Lungs:  CTA B Normal respiratory effort,  no intercostal retractions, no accessory muscle use. Heart: RRR,  no murmur.  Abdomen:  Not distended, soft, non-tender. No rebound or rigidity. No mass,organomegaly Muscle skeletal: no pretibial edema bilaterally  Skin: Exposed areas without rash. Not pale. Not jaundice Neurologic:  alert & oriented X3.  Speech normal, gait appropriate for age and unassisted Strength symmetric and appropriate for age.  Psych: Cognition and judgment appear intact.  Cooperative with normal attention span and concentration.  Behavior appropriate. No anxious or depressed appearing.        Assessment & Plan:

## 2014-08-28 ENCOUNTER — Other Ambulatory Visit (INDEPENDENT_AMBULATORY_CARE_PROVIDER_SITE_OTHER): Payer: BC Managed Care – PPO

## 2014-08-28 DIAGNOSIS — Z0000 Encounter for general adult medical examination without abnormal findings: Secondary | ICD-10-CM

## 2014-08-28 LAB — BASIC METABOLIC PANEL
BUN: 18 mg/dL (ref 6–23)
CHLORIDE: 105 meq/L (ref 96–112)
CO2: 27 mEq/L (ref 19–32)
CREATININE: 1.35 mg/dL (ref 0.40–1.50)
Calcium: 9.3 mg/dL (ref 8.4–10.5)
GFR: 73.99 mL/min (ref >60.00)
Glucose, Bld: 91 mg/dL (ref 70–99)
Potassium: 3.8 mEq/L (ref 3.5–5.1)
Sodium: 137 mEq/L (ref 135–145)

## 2014-08-28 LAB — URINALYSIS, ROUTINE W REFLEX MICROSCOPIC
Bilirubin Urine: NEGATIVE
HGB URINE DIPSTICK: NEGATIVE
Ketones, ur: NEGATIVE
LEUKOCYTES UA: NEGATIVE
NITRITE: NEGATIVE
RBC / HPF: NONE SEEN (ref 0–?)
Total Protein, Urine: NEGATIVE
Urine Glucose: NEGATIVE
Urobilinogen, UA: 0.2 (ref 0.0–1.0)
pH: 6 (ref 5.0–8.0)

## 2014-08-28 LAB — LIPID PANEL
CHOL/HDL RATIO: 3
Cholesterol: 155 mg/dL (ref 0–200)
HDL: 50 mg/dL (ref >39.00)
LDL Cholesterol: 96 mg/dL (ref 0–99)
NONHDL: 105
Triglycerides: 44 mg/dL (ref 0.0–149.0)
VLDL: 8.8 mg/dL (ref 0.0–40.0)

## 2014-11-25 ENCOUNTER — Encounter: Payer: Self-pay | Admitting: Internal Medicine

## 2014-11-27 ENCOUNTER — Encounter: Payer: Self-pay | Admitting: Internal Medicine

## 2014-11-29 ENCOUNTER — Other Ambulatory Visit: Payer: Self-pay | Admitting: Endocrinology

## 2014-12-05 ENCOUNTER — Encounter: Payer: Self-pay | Admitting: Internal Medicine

## 2014-12-05 ENCOUNTER — Other Ambulatory Visit: Payer: Self-pay

## 2014-12-05 DIAGNOSIS — E291 Testicular hypofunction: Secondary | ICD-10-CM

## 2014-12-06 ENCOUNTER — Other Ambulatory Visit (INDEPENDENT_AMBULATORY_CARE_PROVIDER_SITE_OTHER): Payer: BC Managed Care – PPO

## 2014-12-06 DIAGNOSIS — E291 Testicular hypofunction: Secondary | ICD-10-CM

## 2014-12-06 LAB — TESTOSTERONE: TESTOSTERONE: 418.24 ng/dL (ref 300.00–890.00)

## 2014-12-07 LAB — TESTOSTERONE, % FREE

## 2014-12-07 LAB — TESTOSTERONE, FREE

## 2014-12-11 LAB — TESTOSTERONE, FREE, TOTAL, SHBG
SEX HORMONE BINDING: 18 nmol/L (ref 10–50)
TESTOSTERONE: 298 ng/dL — AB (ref 300–890)
Testosterone, Free: 79.3 pg/mL (ref 47.0–244.0)
Testosterone-% Free: 2.7 % (ref 1.6–2.9)

## 2015-01-03 ENCOUNTER — Encounter: Payer: Self-pay | Admitting: Internal Medicine

## 2015-01-05 ENCOUNTER — Ambulatory Visit (INDEPENDENT_AMBULATORY_CARE_PROVIDER_SITE_OTHER): Payer: BC Managed Care – PPO | Admitting: Internal Medicine

## 2015-01-05 ENCOUNTER — Encounter: Payer: Self-pay | Admitting: Internal Medicine

## 2015-01-05 VITALS — BP 124/78 | HR 64 | Temp 97.7°F | Ht 71.0 in | Wt 233.0 lb

## 2015-01-05 DIAGNOSIS — N521 Erectile dysfunction due to diseases classified elsewhere: Secondary | ICD-10-CM | POA: Diagnosis not present

## 2015-01-05 DIAGNOSIS — N529 Male erectile dysfunction, unspecified: Secondary | ICD-10-CM | POA: Diagnosis not present

## 2015-01-05 MED ORDER — SILDENAFIL CITRATE 25 MG PO TABS: 50.0000 mg | ORAL_TABLET | Freq: Every day | ORAL | Status: DC | PRN

## 2015-01-05 NOTE — Assessment & Plan Note (Addendum)
Mild ED, no obvious organic cause detected. Patient is counseled, could be age-related. We agreed on observation for now but he will have a prescription for Viagra in case he needs it. Most likely won't be a permanent prescription rather something to help him with self-confidence temporarily. Side effects and how to use it discuss carefully. Has a "sensation" at the scrotal skin, exam normal, recommend observation

## 2015-01-05 NOTE — Patient Instructions (Signed)
Please call if you have side effects

## 2015-01-05 NOTE — Progress Notes (Signed)
   Subjective:    Patient ID: Tyrone Freeman, Tyrone Freeman    DOB: Dec 09, 1970, 44 y.o.   MRN: [id number]  DOS:  01/05/2015 Type of visit - description : Acute visit Interval history: Patient has noted less spontaneous erections throughout the day, also his erections are not as firm or lasting as much as before. Has a "strange sensation" at the back side of the scrotum. No pain or rash.   Review of Systems No chest pain, no claudication. No decreased libido No anxiety or depression  Past Medical History  Diagnosis Date  . Gastric ulcer   . Esophageal reflux   . Seasonal allergies 05/23/2012  . Hypogonadism Tyrone Freeman     Past Surgical History  Procedure Laterality Date  . No past surgeries      History   Social History  . Marital Status: Married    Spouse Name: N/A  . Number of Children: 1  . 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 Smoking status: Never Smoker   . Smokeless tobacco: Never Used  . Alcohol Use: Yes     Comment: rarely   . Drug Use: No  . Sexual Activity: Not on file   Other Topics Concern  . Not on file   Social History Narrative   Household-- pt, wife , daughter 44 y/o        Medication List       This list is accurate as of: 01/05/15  8:58 AM.  Always use your most recent med list.               PRILOSEC OTC 20 MG tablet  Generic drug:  omeprazole  Take 20 mg by mouth daily as needed.           Objective:   Physical Exam BP 124/78 mmHg  Pulse 64  Temp(Src) 97.7 F (36.5 C) (Oral)  Ht 5\' 11"  (1.803 m)  Wt 233 lb (105.688 kg)  BMI 32.51 kg/m2  SpO2 97%  General:   Well developed, well nourished . NAD.  HEENT:  Normocephalic . Face symmetric, atraumatic GU: Scrotal contents normal, skin normal, no inguinal LADs, no hernias, penis normal Skin: Not pale. Not jaundice Neurologic:  alert & oriented X3.  Speech normal, gait appropriate for age and  unassisted Psych--  Cognition and judgment appear intact.  Cooperative with normal attention span and concentration.  Behavior appropriate. No anxious or depressed appearing.     Assessment & Plan:

## 2015-01-05 NOTE — Progress Notes (Signed)
Pre visit review using our clinic review tool, if applicable. No additional management support is needed unless otherwise documented below in the visit note. 

## 2015-02-27 ENCOUNTER — Encounter: Payer: Self-pay | Admitting: Internal Medicine

## 2015-02-28 ENCOUNTER — Other Ambulatory Visit: Payer: Self-pay

## 2015-02-28 DIAGNOSIS — E291 Testicular hypofunction: Secondary | ICD-10-CM

## 2015-03-01 ENCOUNTER — Other Ambulatory Visit (INDEPENDENT_AMBULATORY_CARE_PROVIDER_SITE_OTHER): Payer: BC Managed Care – PPO

## 2015-03-01 DIAGNOSIS — E291 Testicular hypofunction: Secondary | ICD-10-CM | POA: Diagnosis not present

## 2015-03-02 LAB — TESTOSTERONE, FREE, TOTAL, SHBG
Sex Hormone Binding: 14 nmol/L (ref 10–50)
TESTOSTERONE-% FREE: 2.9 % (ref 1.6–2.9)
TESTOSTERONE: 311 ng/dL (ref 300–890)
Testosterone, Free: 91.2 pg/mL (ref 47.0–244.0)

## 2015-03-27 ENCOUNTER — Encounter: Payer: Self-pay | Admitting: Endocrinology

## 2015-03-27 ENCOUNTER — Ambulatory Visit (INDEPENDENT_AMBULATORY_CARE_PROVIDER_SITE_OTHER): Payer: BC Managed Care – PPO | Admitting: Endocrinology

## 2015-03-27 VITALS — BP 132/80 | HR 59 | Temp 98.7°F | Ht 71.0 in | Wt 233.0 lb

## 2015-03-27 DIAGNOSIS — E291 Testicular hypofunction: Secondary | ICD-10-CM

## 2015-03-27 MED ORDER — CLOMIPHENE CITRATE 50 MG PO TABS: ORAL_TABLET | ORAL | Status: DC

## 2015-03-27 NOTE — Patient Instructions (Signed)
i have sent a prescription to your pharmacy, to renew the clomiphene. Please return in 1 year.

## 2015-03-27 NOTE — Progress Notes (Signed)
   Subjective:    Patient ID: Tyrone Freeman, male    DOB: May 01, 1971, 44 y.o.   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  He has not taken clomid in a few months, but pt states he feels well in general.  He wants to preserve his fertility.  He has mild ED sxs.   Past Medical History  Diagnosis Date  . Gastric ulcer   . Esophageal reflux   . Seasonal allergies 05/23/2012  . Hypogonadism male     Past Surgical History  Procedure Laterality Date  . No past surgeries      Social History   Social History  . Marital Status: Married    Spouse Name: N/A  . Number of Children: 1  . 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 Smoking status: Never Smoker   . Smokeless tobacco: Never Used  . Alcohol Use: Yes     Comment: rarely   . Drug Use: No  . Sexual Activity: Not on file   Other Topics Concern  . Not on file   Social History Narrative   Household-- pt, wife , daughter 83 y/o    Current Outpatient Prescriptions on File Prior to Visit  Medication Sig Dispense Refill  . omeprazole (PRILOSEC OTC) 20 MG tablet Take 20 mg by mouth daily as needed.     . sildenafil (VIAGRA) 25 MG tablet Take 2-3 tablets (50-75 mg total) by mouth daily as needed for erectile dysfunction. 30 tablet 0   No current facility-administered medications on file prior to visit.    Allergies  Allergen Reactions  . Hydrocodone     Nausea, no rash or itching    Family History  Problem Relation Age of Onset  . CAD Neg Hx   . Diabetes Neg Hx   . Colon cancer Neg Hx   . Prostate cancer Other     GF dx in his 80    BP 132/80 mmHg  Pulse 59  Temp(Src) 98.7 F (37.1 C) (Oral)  Ht  (1.803 m)  Wt 233 lb (105.688 kg)  BMI 32.51 kg/m2  SpO2 97%   Review of  Systems Denies decreased urinary stream.  Denies edema.      Objective:   Physical Exam VITAL SIGNS:  See vs page GENERAL: no distress GENITALIA: Normal male testicles, scrotum, and penis.   Lab Results  Component Value Date   TESTOSTERONE 311 03/01/2015      Assessment & Plan:  Hypogonadism: well-controlled.    Patient is advised the following: Patient Instructions  i have sent a prescription to your pharmacy, to renew the clomiphene. Please return in 1 year.

## 2015-07-30 ENCOUNTER — Telehealth: Payer: Self-pay | Admitting: Internal Medicine

## 2015-07-30 NOTE — Telephone Encounter (Signed)
Patient declined receiving flu shot  °

## 2015-07-31 NOTE — Telephone Encounter (Signed)
Flu shot declined in Chart.  

## 2015-08-27 ENCOUNTER — Encounter: Payer: Self-pay | Admitting: Behavioral Health

## 2015-08-27 ENCOUNTER — Telehealth: Payer: Self-pay | Admitting: Behavioral Health

## 2015-08-27 NOTE — Telephone Encounter (Signed)
Pre-Visit Call completed with patient and chart updated.   Pre-Visit Info documented in Specialty Comments under [employer].    

## 2015-08-27 NOTE — Addendum Note (Signed)
Addended by: Harold Barban E on: 08/27/2015 12:17 PM   Modules accepted: Orders, Medications

## 2015-08-27 NOTE — Telephone Encounter (Signed)
Unable to reach patient at time of Pre-Visit Call.  Left message for patient to return call when available.    

## 2015-08-28 ENCOUNTER — Ambulatory Visit (INDEPENDENT_AMBULATORY_CARE_PROVIDER_SITE_OTHER): Payer: BC Managed Care – PPO | Admitting: Internal Medicine

## 2015-08-28 ENCOUNTER — Encounter: Payer: Self-pay | Admitting: Internal Medicine

## 2015-08-28 VITALS — BP 120/78 | HR 61 | Temp 97.9°F | Ht 71.0 in | Wt 238.1 lb

## 2015-08-28 DIAGNOSIS — Z09 Encounter for follow-up examination after completed treatment for conditions other than malignant neoplasm: Secondary | ICD-10-CM | POA: Diagnosis not present

## 2015-08-28 DIAGNOSIS — Z0000 Encounter for general adult medical examination without abnormal findings: Secondary | ICD-10-CM | POA: Diagnosis not present

## 2015-08-28 LAB — CBC WITH DIFFERENTIAL/PLATELET
BASOS PCT: 0.5 % (ref 0.0–3.0)
Basophils Absolute: 0 10*3/uL (ref 0.0–0.1)
EOS PCT: 7.1 % — AB (ref 0.0–5.0)
Eosinophils Absolute: 0.4 10*3/uL (ref 0.0–0.7)
HCT: 46.8 % (ref 39.0–52.0)
Hemoglobin: 16 g/dL (ref 13.0–17.0)
LYMPHS ABS: 1.5 10*3/uL (ref 0.7–4.0)
Lymphocytes Relative: 28 % (ref 12.0–46.0)
MCHC: 34.3 g/dL (ref 30.0–36.0)
MCV: 87.3 fl (ref 78.0–100.0)
MONO ABS: 0.3 10*3/uL (ref 0.1–1.0)
MONOS PCT: 5.6 % (ref 3.0–12.0)
NEUTROS ABS: 3.1 10*3/uL (ref 1.4–7.7)
NEUTROS PCT: 58.8 % (ref 43.0–77.0)
PLATELETS: 168 10*3/uL (ref 150.0–400.0)
RBC: 5.36 Mil/uL (ref 4.22–5.81)
RDW: 13.1 % (ref 11.5–15.5)
WBC: 5.3 10*3/uL (ref 4.0–10.5)

## 2015-08-28 LAB — BASIC METABOLIC PANEL
BUN: 15 mg/dL (ref 6–23)
CO2: 30 meq/L (ref 19–32)
Calcium: 9.4 mg/dL (ref 8.4–10.5)
Chloride: 106 mEq/L (ref 96–112)
Creatinine, Ser: 1.24 mg/dL (ref 0.40–1.50)
GFR: 81.24 mL/min (ref >60.00)
GLUCOSE: 89 mg/dL (ref 70–99)
POTASSIUM: 4.2 meq/L (ref 3.5–5.1)
SODIUM: 138 meq/L (ref 135–145)

## 2015-08-28 LAB — LIPID PANEL
CHOLESTEROL: 161 mg/dL (ref 0–200)
HDL: 51.2 mg/dL (ref >39.00)
LDL Cholesterol: 97 mg/dL (ref 0–99)
NONHDL: 109.94
Total CHOL/HDL Ratio: 3
Triglycerides: 65 mg/dL (ref 0.0–149.0)
VLDL: 13 mg/dL (ref 0.0–40.0)

## 2015-08-28 NOTE — Assessment & Plan Note (Addendum)
Td 07 cscope-- never   Prostate cancer screening: Guidelines have changed significantly, will consider doing a DRE-PSA next year since his AA and has a mild FH of prostate cancer. Diet-exercise :  excellent lifestyle  Labs  RTC 1 year

## 2015-08-28 NOTE — Progress Notes (Signed)
Pre visit review using our clinic review tool, if applicable. No additional management support is needed unless otherwise documented below in the visit note. 

## 2015-08-28 NOTE — Patient Instructions (Signed)
GO TO THE LAB : Get the blood work    GO TO THE FRONT DESK Schedule a physical exam to be done in  1 year Please be fasting

## 2015-08-28 NOTE — Progress Notes (Signed)
Subjective:    Patient ID: Tyrone Freeman, male    DOB: 07-06-1970, 45 y.o.   MRN: [id number]  DOS:  08/28/2015 Type of visit - description : CPX Interval history: No concerns, feeling great    Review of Systems  Constitutional: No fever. No chills. No unexplained wt changes. No unusual sweats  HEENT: Sees the dentist regularly,no ear discharge, no facial swelling, no voice changes. No eye discharge, no eye  redness , no  intolerance to light   Respiratory: No wheezing , no  difficulty breathing. No cough , no mucus production  Cardiovascular: No CP, no leg swelling , no  Palpitations  GI: no nausea, no vomiting, no diarrhea , no  abdominal pain.  No blood in the stools. No dysphagia, no odynophagia    Endocrine: No polyphagia, no polyuria , no polydipsia  GU: No dysuria, gross hematuria, difficulty urinating. No urinary urgency, no frequency.  Musculoskeletal: No joint swellings or unusual aches or pains  Skin: No change in the color of the skin, palor , no  Rash  Allergic, immunologic: No environmental allergies , no  food allergies  Neurological: No dizziness no  syncope. No headaches. No diplopia, no slurred, no slurred speech, no motor deficits, no facial  Numbness  Hematological: No enlarged lymph nodes, no easy bruising , no unusual bleedings  Psychiatry: No suicidal ideas, no hallucinations, no beavior problems, no confusion.  No unusual/severe anxiety, no depression    Past Medical History  Diagnosis Date  . Gastric ulcer   . Esophageal reflux   . Seasonal allergies 05/23/2012  . Hypogonadism male     Past Surgical History  Procedure Laterality Date  . No past surgeries      Social History   Social History  . Marital Status: Married    Spouse Name: N/A  . Number of Children: 1  . 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 Smoking status: Never Smoker   . Smokeless  tobacco: Never Used  . Alcohol Use: Yes     Comment: rarely   . Drug Use: No  . Sexual Activity: Not on file   Other Topics Concern  . Not on file   Social History Narrative   Household-- pt, wife , daughter 59 y/o     Family History  Problem Relation Age of Onset  . CAD Neg Hx   . Diabetes Neg Hx   . Colon cancer Neg Hx   . Prostate cancer Other     GF dx in his 6       Medication List       This list is accurate as of: 08/28/15 11:59 PM.  Always use your most recent med list.               PRILOSEC OTC 20 MG tablet  Generic drug:  omeprazole  Take 20 mg by mouth daily as needed.           Objective:   Physical Exam BP 120/78 mmHg  Pulse 61  Temp(Src) 97.9 F (36.6 C) (Oral)  Ht  (1.803 m)  Wt 238 lb 2 oz (108.013 kg)  BMI 33.23 kg/m2  SpO2 98% General:   Well developed, well nourished . NAD.  Neck:   No  Thyromegaly  HEENT:  Normocephalic . Face symmetric, atraumatic Lungs:  CTA B Normal respiratory effort, no intercostal retractions, no accessory  muscle use. Heart: RRR,  no murmur.  No pretibial edema bilaterally  Abdomen:  Not distended, soft, non-tender. No rebound or rigidity.  Skin: Exposed areas without rash. Not pale. Not jaundice Neurologic:  alert & oriented X3.  Speech normal, gait appropriate for age and unassisted Strength symmetric and appropriate for age.  Psych: Cognition and judgment appear intact.  Cooperative with normal attention span and concentration.  Behavior appropriate. No anxious or depressed appearing.    Assessment & Plan:   Assessment GERD, h/o gastric ulcer H/o hypogonadism Mild, central hypogonadism, saw endo 2014, was rx p Clomid , took x 1 week, no s/e. Brain MRI was  not warranted  Seasonal allergies   PLAN History of hypogonadism, last testosterone 03-2015 normal, asymptomatic. RTC one year

## 2015-08-29 NOTE — Assessment & Plan Note (Signed)
History of hypogonadism, last testosterone 03-2015 normal, asymptomatic. RTC one year

## 2015-10-23 ENCOUNTER — Encounter: Payer: Self-pay | Admitting: Internal Medicine

## 2015-10-23 ENCOUNTER — Telehealth: Payer: Self-pay | Admitting: Internal Medicine

## 2015-10-23 NOTE — Telephone Encounter (Signed)
[facility] Day - Client TELEPHONE ADVICE RECORD [facility] Patient Name: Tyrone AtesMANAUEL Dragoo DOB: 28-Jul-1970 Initial Comment caller is from Dr Willow OraJose Paz office, patient has chest pains Nurse Assessment Nurse: Debera Latalston, RN, Tinnie GensJeffrey Date/Time Lamount Cohen(Eastern Time): 10/23/2015 3:35:30 PM Confirm and document reason for call. If symptomatic, describe symptoms. You must click the next button to save text entered. ---Caller is from Dr Willow OraJose Paz office, patient has chest pains. Started 3 weeks ago. No pains now. Only in the mornings. Has been using nexium. Has the patient traveled out of the country within the last 30 days? ---No Does the patient have any new or worsening symptoms? ---Yes Will a triage be completed? ---Yes Related visit to physician within the last 2 weeks? ---No Does the PT have any chronic conditions? (i.e. diabetes, asthma, etc.) ---No Is this a behavioral health or substance abuse call? ---No Guidelines Guideline Title Affirmed Question Affirmed Notes Chest Pain [1] Patient claims chest pain is same as previously diagnosed "heartburn" AND [2] describes burning in chest AND [3] accompanying sour taste in mouth Final Disposition User Call PCP within 24 Hours Debera Latalston, RN, Abbott LaboratoriesJeffrey Referrals REFERRED TO PCP OFFICE REFERRED TO PCP OFFICE Disagree/Comply: Comply

## 2015-10-23 NOTE — Telephone Encounter (Signed)
[facility]  See note below.  Pt has an appt scheduled with Dr. Drue NovelPaz on 10/24/15 at 9:30 am.

## 2015-10-23 NOTE — Telephone Encounter (Signed)
Pt scheduled appt thru mychart for 10/29/15, I left him a VM to call back for triage - pt on the line and [facility].

## 2015-10-24 ENCOUNTER — Ambulatory Visit (INDEPENDENT_AMBULATORY_CARE_PROVIDER_SITE_OTHER): Payer: BC Managed Care – PPO | Admitting: Internal Medicine

## 2015-10-24 ENCOUNTER — Encounter: Payer: Self-pay | Admitting: Internal Medicine

## 2015-10-24 VITALS — BP 112/80 | HR 63 | Temp 98.0°F | Resp 16 | Ht 71.0 in | Wt 240.2 lb

## 2015-10-24 DIAGNOSIS — R0789 Other chest pain: Secondary | ICD-10-CM

## 2015-10-24 DIAGNOSIS — Z09 Encounter for follow-up examination after completed treatment for conditions other than malignant neoplasm: Secondary | ICD-10-CM

## 2015-10-24 MED ORDER — PANTOPRAZOLE SODIUM 40 MG PO TBEC: 40.0000 mg | DELAYED_RELEASE_TABLET | Freq: Every day | ORAL | Status: DC

## 2015-10-24 NOTE — Progress Notes (Signed)
Pre visit review using our clinic review tool, if applicable. No additional management support is needed unless otherwise documented below in the visit note/SLS  

## 2015-10-24 NOTE — Patient Instructions (Signed)
Take pantoprazole 1 tablet every morning before breakfast for 6 weeks, then as needed  Take Mylanta every night for the next 2 weeks  Call if   you have severe chest pain, pain is not better or if it is different

## 2015-10-24 NOTE — Progress Notes (Signed)
Subjective:    Patient ID: Tyrone Freeman, male    DOB: 02-Apr-1971, 45 y.o.   MRN: [id number]  DOS:  10/24/2015 Type of visit - description :  Acute visit Interval history:  Symptoms started 3 weeks ago with tightness at the anterior mid chest, every morning; after he moves, takes his breakfast and has a bowel movement the pain spontaneously resolves. No radiation, no associated nausea, sweats, no change with food intake. In the past, similar symptoms were associated with GERD; this time  he took Prilosec and Nexium without much help. His diet is healthy. Last night he called the office, was recommended Mylanta which he took --->  essentially no symptoms this morning.  Review of Systems Denies fever chills No difficulty breathing or edema No dysphagia Occasionally has a burning sensation in the chest when he swallows certain foods like tomato; those  type of symptoms have been more noticeable lately. No recent airplane trips or prolonged car trip Patient continue exercising without chest pain or difficulty breathing  Past Medical History  Diagnosis Date  . Gastric ulcer   . Esophageal reflux   . Seasonal allergies 05/23/2012  . Hypogonadism male     Past Surgical History  Procedure Laterality Date  . No past surgeries      Social History   Social History  . Marital Status: Married    Spouse Name: N/A  . Number of Children: 1  . 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 Smoking status: Never Smoker   . Smokeless tobacco: Never Used  . Alcohol Use: Yes     Comment: rarely   . Drug Use: No  . Sexual Activity: Not on file   Other Topics Concern  . Not on file   Social History Narrative   Household-- pt, wife , daughter 43 y/o        Medication List       This list is accurate as of: 10/24/15  3:01 PM.  Always use your most recent med list.               MYLANTA DOUBLE-STRENGTH  400-400-40 MG/5ML suspension  Generic drug:  alum & mag hydroxide-simeth  Take 5 mLs by mouth every 6 (six) hours as needed for indigestion. Over The Counter     pantoprazole 40 MG tablet  Commonly known as:  PROTONIX  Take 1 tablet (40 mg total) by mouth daily.           Objective:   Physical Exam BP 112/80 mmHg  Pulse 63  Temp(Src) 98 F (36.7 C) (Oral)  Resp 16  Ht  (1.803 m)  Wt 240 lb 4 oz (108.977 kg)  BMI 33.52 kg/m2  SpO2 98% General:   Well developed, well nourished . NAD.  HEENT:  Normocephalic . Face symmetric, atraumatic Lungs:  CTA B Normal respiratory effort, no intercostal retractions, no accessory muscle use. Heart: RRR,  no murmur.  no pretibial edema bilaterally  Abdomen:  Not distended, soft, non-tender. No rebound or rigidity.  Skin: Not pale. Not jaundice Neurologic:  alert & oriented X3.  Speech normal, gait appropriate for age and unassisted Psych--  Cognition and judgment appear intact.  Cooperative with normal attention span and concentration.  Behavior appropriate. No anxious or depressed appearing.    Assessment & Plan:   Assessment GERD, h/o gastric ulcer H/o hypogonadism Mild, central hypogonadism, saw  endo 2014, was rx p Clomid , took x 1 week, no s/e. Brain MRI was  not warranted  Seasonal allergies   PLAN Chest tightness: Historically, sx usually related to GERD, did not improve much with PPIs but sx gone after one dose of Mylanta. No FH of CAD, no diabetes, HTN, no exertional symptoms. RX empiric PPIs and Mylanta, see AVS. If not better he knows to call for for eval. Patient agrees with my plan

## 2015-10-24 NOTE — Assessment & Plan Note (Signed)
Chest tightness: Historically, sx usually related to GERD, did not improve much with PPIs but sx gone after one dose of Mylanta. No FH of CAD, no diabetes, HTN, no exertional symptoms. RX empiric PPIs and Mylanta, see AVS. If not better he knows to call for for eval. Patient agrees with my plan

## 2015-10-29 ENCOUNTER — Ambulatory Visit: Payer: Self-pay | Admitting: Internal Medicine

## 2016-01-22 ENCOUNTER — Other Ambulatory Visit: Payer: Self-pay | Admitting: Endocrinology

## 2016-01-22 NOTE — Telephone Encounter (Signed)
Please refill x 3 months Ov is due 

## 2016-03-13 ENCOUNTER — Encounter: Payer: Self-pay | Admitting: Medical

## 2016-03-13 ENCOUNTER — Other Ambulatory Visit (HOSPITAL_COMMUNITY)
Admission: RE | Admit: 2016-03-13 | Discharge: 2016-03-13 | Disposition: A | Discharge status: Home / Self Care | Payer: BC Managed Care – PPO | Source: Ambulatory Visit | Attending: Medical | Admitting: Medical

## 2016-03-13 ENCOUNTER — Ambulatory Visit (INDEPENDENT_AMBULATORY_CARE_PROVIDER_SITE_OTHER): Payer: BC Managed Care – PPO | Admitting: Medical

## 2016-03-13 VITALS — BP 124/80 | HR 65 | Temp 98.3°F | Ht 71.0 in | Wt 244.0 lb

## 2016-03-13 DIAGNOSIS — R300 Dysuria: Secondary | ICD-10-CM

## 2016-03-13 DIAGNOSIS — Z113 Encounter for screening for infections with a predominantly sexual mode of transmission: Secondary | ICD-10-CM | POA: Diagnosis not present

## 2016-03-13 DIAGNOSIS — Z125 Encounter for screening for malignant neoplasm of prostate: Secondary | ICD-10-CM | POA: Diagnosis not present

## 2016-03-13 DIAGNOSIS — R350 Frequency of micturition: Secondary | ICD-10-CM

## 2016-03-13 LAB — POCT URINALYSIS DIPSTICK
Bilirubin, UA: NEGATIVE
Glucose, UA: NEGATIVE
KETONES UA: NEGATIVE
Leukocytes, UA: NEGATIVE
Nitrite, UA: NEGATIVE
PH UA: 6
PROTEIN UA: 0.15
RBC UA: NEGATIVE

## 2016-03-13 MED ORDER — CIPROFLOXACIN HCL 500 MG PO TABS: 500.0000 mg | ORAL_TABLET | Freq: Two times a day (BID) | ORAL | 0 refills | Status: DC

## 2016-03-13 NOTE — Patient Instructions (Addendum)
For your dysuria will get urine culture study, ancillary studies and future psa.  Will rx cipro antibiotic and hydrate well.  We will call you on the urine results.  You can come in and get psa 2-3 weeks. Can go directly to lab.  Follow up in 2-3 weeks office visit if you have residual.

## 2016-03-13 NOTE — Progress Notes (Signed)
Subjective:    Patient ID: Tyrone Freeman, male    DOB: 08-Nov-1970, 45 y.o.   MRN: [id number]  HPI  Pt in with some pain on urination. Pain for 2 days. He states only slight stinging at the tip when urinates. No testicle pain. No dc. Pt has no fever, no chills or sweats.  Pt speculates that he have had episode of prostatitis 3 years ago. He had cipro for one event of dysuria on review.  Pt not drinking soda. Recent taking vitamins and pomegrnate juice.  No symptoms after sex with new partner.  No family hx of prostate issues dad, uncles or brothers.  Grandad prostate ca late 70's or 80      Review of Systems  Constitutional: Negative for chills, fatigue and fever.  Respiratory: Negative for cough, chest tightness, shortness of breath and wheezing.   Cardiovascular: Negative for chest pain and palpitations.  Gastrointestinal: Negative for abdominal pain.  Genitourinary: Positive for dysuria. Negative for decreased urine volume, difficulty urinating, flank pain, frequency, hematuria, testicular pain and urgency.  Musculoskeletal: Negative for back pain.  Neurological: Negative for dizziness and headaches.  Hematological: Negative for adenopathy. Does not bruise/bleed easily.  Psychiatric/Behavioral: Negative for behavioral problems and confusion.    Past Medical History:  Diagnosis Date  . Esophageal reflux   . Gastric ulcer   . Hypogonadism male   . Seasonal allergies 05/23/2012     Social History   Social History  . Marital status: Married    Spouse name: N/A  . Number of children: 1  . Years of education: N/A   Occupational History  . 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 Smoking status: Never Smoker  . Smokeless tobacco: Never Used  . Alcohol use Yes     Comment: rarely   . Drug use: No  . Sexual activity: Not on file   Other Topics Concern  . Not on file   Social History Narrative   Household-- pt, wife , daughter 47  y/o    Past Surgical History:  Procedure Laterality Date  . NO PAST SURGERIES      Family History  Problem Relation Age of Onset  . CAD Neg Hx   . Diabetes Neg Hx   . Colon cancer Neg Hx   . Prostate cancer Other     GF dx in his 33    Allergies  Allergen Reactions  . Hydrocodone     Nausea, no rash or itching    Current Outpatient Prescriptions on File Prior to Visit  Medication Sig Dispense Refill  . alum & mag hydroxide-simeth (MYLANTA DOUBLE-STRENGTH) 400-400-40 MG/5ML suspension Take 5 mLs by mouth every 6 (six) hours as needed for indigestion. Over The Counter    . clomiPHENE (CLOMID) 50 MG tablet TAKE 1/4 TABLET BY MOUTH ONCE DAILY 10 tablet 3  . pantoprazole (PROTONIX) 40 MG tablet Take 1 tablet (40 mg total) by mouth daily. 30 tablet 3   No current facility-administered medications on file prior to visit.     BP 124/80   Pulse 65   Temp 98.3 F (36.8 C) (Oral)   Ht 5\' 11"  (1.803 m)   Wt 244 lb (110.7 kg)   SpO2 98%   BMI 34.03 kg/m       Objective:   Physical Exam  General Appearance- Not in acute distress.  HEENT Eyes- Scleraeral/Conjuntiva-bilat- Not Yellow. Mouth & Throat- Normal.  Chest and Lung Exam Auscultation:  Breath sounds:-Normal. Adventitious sounds:- No Adventitious sounds.  Cardiovascular Auscultation:Rythm - Regular. Heart Sounds -Normal heart sounds.  Abdomen Inspection:-Inspection Normal.  Palpation/Perucssion: Palpation and Percussion of the abdomen reveal- Non Tender, No Rebound tenderness, No rigidity(Guarding) and No Palpable abdominal masses.  Liver:-Normal.  Spleen:- Normal.         Assessment & Plan:  For your dysuria will get urine culture study, ancillary studies and future psa.  Will rx cipro antibiotic and hydrate well.  We will call you on the urine results.  You can come in and get psa 2-3 weeks. Can go directly to lab.  Follow up in 2-3 weeks office visit if you have residual.

## 2016-03-14 LAB — URINE CULTURE: ORGANISM ID, BACTERIA: NO GROWTH

## 2016-03-14 LAB — URINE CYTOLOGY ANCILLARY ONLY
CHLAMYDIA, DNA PROBE: NEGATIVE
NEISSERIA GONORRHEA: NEGATIVE
Trichomonas: NEGATIVE

## 2016-03-17 LAB — URINE CYTOLOGY ANCILLARY ONLY
Bacterial vaginitis: NEGATIVE
Candida vaginitis: NEGATIVE

## 2016-03-18 ENCOUNTER — Encounter: Payer: Self-pay | Admitting: Medical

## 2016-03-18 DIAGNOSIS — R7989 Other specified abnormal findings of blood chemistry: Secondary | ICD-10-CM

## 2016-03-29 MED ORDER — CIPROFLOXACIN HCL 500 MG PO TABS: 500.0000 mg | ORAL_TABLET | Freq: Two times a day (BID) | ORAL | 0 refills | Status: DC

## 2016-03-29 NOTE — Telephone Encounter (Signed)
Sent in another week of cipro. Please remind pt to come in and get psa. Give us update after 7 days of cipro. May refer to urologist if any residual symptoms or if psa elevated.

## 2016-04-04 NOTE — Telephone Encounter (Signed)
Looks like pt still not gotten psa. How is he doing. See my last my chart message. Will you call pt?

## 2016-04-08 NOTE — Telephone Encounter (Signed)
Called patient. He states he's doing much better.  Not experiencing any issues at all.  Agrees to come in tomorrow to have PSA drawn.  Lab appointment scheduled.  During call, he asked if he could have his testosterone drawn as well while he is here.  States Dr. Drue NovelPaz typically does it for him once year.  Last testosterone (03/01/15).    Please advise.

## 2016-04-09 ENCOUNTER — Other Ambulatory Visit (INDEPENDENT_AMBULATORY_CARE_PROVIDER_SITE_OTHER): Payer: BC Managed Care – PPO

## 2016-04-09 DIAGNOSIS — R350 Frequency of micturition: Secondary | ICD-10-CM | POA: Diagnosis not present

## 2016-04-09 DIAGNOSIS — R300 Dysuria: Secondary | ICD-10-CM

## 2016-04-09 DIAGNOSIS — Z125 Encounter for screening for malignant neoplasm of prostate: Secondary | ICD-10-CM

## 2016-04-09 LAB — PSA: PSA: 0.71 ng/mL (ref 0.10–4.00)

## 2016-04-11 NOTE — Telephone Encounter (Addendum)
Pt had PSA drawn on 04/09/16.  Pt would like to have testosterone drawn.     Please advise.

## 2016-04-12 NOTE — Telephone Encounter (Signed)
I saw his testosterone request later. I had to scroll down on my chart message. I did put future testosterone order in. He could get that done when he wants. Sorry did see earlier.

## 2016-08-31 ENCOUNTER — Encounter: Payer: Self-pay | Admitting: Internal Medicine

## 2016-09-03 MED ORDER — SILDENAFIL CITRATE 20 MG PO TABS: 60.0000 mg | ORAL_TABLET | Freq: Every day | ORAL | 5 refills | Status: DC | PRN

## 2016-09-03 NOTE — Telephone Encounter (Signed)
viagra , send  Received: Yesterday  Message Contents  Tyrone PlumpJose E Paz, MD  Conrad BurlingtonKaylyn Jeremy Freeman, CMA        Sildenafil 20 mg take 4 or 5 tablets qd as needed, will send #30 and 5 RF

## 2016-09-03 NOTE — Telephone Encounter (Signed)
Rx sent 

## 2016-10-15 ENCOUNTER — Telehealth: Payer: Self-pay

## 2016-10-15 NOTE — Telephone Encounter (Signed)
Received PA denial. Plan does not allow coverage of Revatio or sildenafil if the dx is not pulmonary arterial hypertension (PAH) or secondary Raynaud's phenomenon. PA denial notification sent for scanning.

## 2016-10-15 NOTE — Telephone Encounter (Signed)
PA initiated via Covermymeds; KEY: [id number]. Awaiting determination.

## 2016-11-25 ENCOUNTER — Ambulatory Visit (INDEPENDENT_AMBULATORY_CARE_PROVIDER_SITE_OTHER): Payer: BC Managed Care – PPO | Admitting: Medical

## 2016-11-25 ENCOUNTER — Encounter: Payer: Self-pay | Admitting: Medical

## 2016-11-25 VITALS — BP 111/73 | HR 61 | Temp 98.2°F | Resp 16 | Ht 73.0 in | Wt 248.4 lb

## 2016-11-25 DIAGNOSIS — H938X1 Other specified disorders of right ear: Secondary | ICD-10-CM

## 2016-11-25 DIAGNOSIS — L739 Follicular disorder, unspecified: Secondary | ICD-10-CM | POA: Diagnosis not present

## 2016-11-25 DIAGNOSIS — H6091 Unspecified otitis externa, right ear: Secondary | ICD-10-CM

## 2016-11-25 DIAGNOSIS — H9201 Otalgia, right ear: Secondary | ICD-10-CM | POA: Diagnosis not present

## 2016-11-25 MED ORDER — NEOMYCIN-POLYMYXIN-HC 1 % OT SOLN: 3.0000 [drp] | Freq: Three times a day (TID) | OTIC | 0 refills | Status: DC

## 2016-11-25 MED ORDER — FLUTICASONE PROPIONATE 50 MCG/ACT NA SUSP: 2.0000 | Freq: Every day | NASAL | 1 refills | Status: DC

## 2016-11-25 MED ORDER — AMOXICILLIN-POT CLAVULANATE 875-125 MG PO TABS: 1.0000 | ORAL_TABLET | Freq: Two times a day (BID) | ORAL | 0 refills | Status: DC

## 2016-11-25 NOTE — Progress Notes (Signed)
Subjective:    Patient ID: Tyrone Freeman, male    DOB: [birth year], 46 y.o.   MRN: [id number]  HPI  Pt in feeling has rt ear discomfort. Almost feels like something in ear for 1 month. . Pt thought maybe allergy related. [facility] and [facility] advised using benadryl but he did not use. He explained he had been trying zyrtec.  He also explains small lump in rt side cheek/rt lower jaw area.   Pt states only faint mild nasal congestion after cutting grass. But no obvious symptoms.   Review of Systems  Constitutional: Negative for chills, fatigue and fever.  HENT: Positive for congestion and ear pain. Negative for hearing loss, nosebleeds, postnasal drip, sinus pain and sneezing.        Maybe mild congestion at best.  Respiratory: Negative for cough, chest tightness and wheezing.   Cardiovascular: Negative for chest pain and palpitations.  Gastrointestinal: Negative for abdominal pain.  Musculoskeletal: Positive for back pain.  Skin: Negative for rash.  Neurological: Negative for dizziness and weakness.  Hematological: Negative for adenopathy. Does not bruise/bleed easily.       See hpi lymph node in differential dx but not obvious node in rt mandible area.  Psychiatric/Behavioral: Negative for behavioral problems and confusion.   Past Medical History:  Diagnosis Date  . Esophageal reflux   . Gastric ulcer   . Hypogonadism male   . Seasonal allergies 05/23/2012     Social History   Social History  . Marital status: Married    Spouse name: N/A  . Number of children: 1  . Years of education: N/A   Occupational History  . 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 Smoking status: Never Smoker  . Smokeless tobacco: Never Used  . Alcohol use Yes     Comment: rarely   . Drug use: No  . Sexual activity: Not on file   Other Topics Concern  . Not on file   Social History Narrative   Household-- pt, wife , daughter 43 y/o     Past Surgical History:  Procedure Laterality Date  . NO PAST SURGERIES      Family History  Problem Relation Age of Onset  . CAD Neg Hx   . Diabetes Neg Hx   . Colon cancer Neg Hx   . Prostate cancer Other        GF dx in his 65    Allergies  Allergen Reactions  . Hydrocodone     Nausea, no rash or itching    Current Outpatient Prescriptions on File Prior to Visit  Medication Sig Dispense Refill  . sildenafil (REVATIO) 20 MG tablet Take 3-4 tablets (60-80 mg total) by mouth daily as needed. 30 tablet 5   No current facility-administered medications on file prior to visit.     BP 111/73 (BP Location: Right Arm, Patient Position: Sitting, Cuff Size: Large)   Pulse 61   Temp 98.2 F (36.8 C) (Oral)   Resp 16   Ht 6\' 1"  (1.854 m)   Wt 248 lb 6.4 oz (112.7 kg)   SpO2 99%   BMI 32.77 kg/m       Objective:   Physical Exam   General  Mental Status - Alert. General Appearance - Well groomed. Not in acute distress.  Skin Rashes- No Rashes.  HEENT Head- Normal. Ear Auditory Canal - Left- Normal. Right - bottom of canal  looks red and inflamed.Tympanic Membrane- Left- Normal. Right- mild pinkish red appearance to tm. Eye Sclera/Conjunctiva- Left- Normal. Right- Normal. Nose & Sinuses Nasal Mucosa- Left-  Boggy and Congested. Right-  Boggy and  Congested.Bilateral  No maxillary and no  frontal sinus pressure. Mouth & Throat Lips: Upper Lip- Normal: no dryness, cracking, pallor, cyanosis, or vesicular eruption. Lower Lip-Normal: no dryness, cracking, pallor, cyanosis or vesicular eruption. Buccal Mucosa- Bilateral- No Aphthous ulcers. Oropharynx- No Discharge or Erythema. Tonsils: Characteristics- Bilateral- No Erythema or Congestion. Size/Enlargement- Bilateral- No enlargement. Discharge- bilateral-None. Rt mid angle of mandible region superficial indurated area with edges that may be appear cystic vs lymph node. (presenlty appears maybe inflamed  follicle)  Neck Neck- Supple. No Masses.   Chest and Lung Exam Auscultation: Breath Sounds:-Clear even and unlabored.  Cardiovascular Auscultation:Rythm- Regular, rate and rhythm. Murmurs & Other Heart Sounds:Ausculatation of the heart reveal- No Murmurs.  Lymphatic Head & Neck General Head & Neck Lymphatics: Bilateral: Description- No Localized lymphadenopathy.        Assessment & Plan:  By exam concern for folliculitis rt cheek, early infection ear drum, increased pressure in rt ear and some swelling of outer canal.  For cheek and mild red appearance of tympanic membrane rx augmentin antibiotic. For appearance swollen ear canal cortisporin otic drops. To decrease swelling inside nose and ear pressure rx flonase.  Follow up 7-10 days or as needed

## 2016-11-25 NOTE — Patient Instructions (Addendum)
By exam concern for folliculitis rt cheek, early infection ear drum, increased pressure in rt ear and some swelling of outer ear canal.  For cheek and mild red appearance of tympanic membrane rx augmentin antibiotic. For appearance swollen ear canal cortisporin otic drops. To decrease swelling inside nose and ear pressure rx flonase.  Follow up 7-10 days or as needed

## 2016-12-09 ENCOUNTER — Ambulatory Visit: Payer: Self-pay | Admitting: Internal Medicine

## 2016-12-09 ENCOUNTER — Telehealth: Payer: Self-pay | Admitting: Internal Medicine

## 2016-12-09 NOTE — Telephone Encounter (Signed)
Ok to waive 

## 2016-12-09 NOTE — Telephone Encounter (Signed)
Patient lvm 7:47am cancelling 4pm appointment for today stating he is doing fine and would like $50 no show fee waived, please advise

## 2016-12-25 ENCOUNTER — Ambulatory Visit (INDEPENDENT_AMBULATORY_CARE_PROVIDER_SITE_OTHER): Payer: BC Managed Care – PPO | Admitting: Medical

## 2016-12-25 ENCOUNTER — Ambulatory Visit (HOSPITAL_BASED_OUTPATIENT_CLINIC_OR_DEPARTMENT_OTHER)
Admission: RE | Admit: 2016-12-25 | Discharge: 2016-12-25 | Disposition: A | Discharge status: Home / Self Care | Payer: BC Managed Care – PPO | Source: Ambulatory Visit | Attending: Medical | Admitting: Medical

## 2016-12-25 VITALS — BP 123/83 | HR 64 | Temp 98.1°F | Resp 16 | Ht 72.0 in | Wt 245.2 lb

## 2016-12-25 DIAGNOSIS — S76211A Strain of adductor muscle, fascia and tendon of right thigh, initial encounter: Secondary | ICD-10-CM

## 2016-12-25 DIAGNOSIS — R1031 Right lower quadrant pain: Secondary | ICD-10-CM | POA: Insufficient documentation

## 2016-12-25 DIAGNOSIS — M25551 Pain in right hip: Secondary | ICD-10-CM | POA: Diagnosis present

## 2016-12-25 DIAGNOSIS — X58XXXA Exposure to other specified factors, initial encounter: Secondary | ICD-10-CM | POA: Insufficient documentation

## 2016-12-25 DIAGNOSIS — M1612 Unilateral primary osteoarthritis, left hip: Secondary | ICD-10-CM | POA: Diagnosis not present

## 2016-12-25 MED ORDER — DICLOFENAC SODIUM 75 MG PO TBEC: 75.0000 mg | DELAYED_RELEASE_TABLET | Freq: Two times a day (BID) | ORAL | 0 refills | Status: DC

## 2016-12-25 NOTE — Progress Notes (Signed)
Subjective:    Patient ID: Tyrone Freeman, male    DOB: 1970/07/28, 46 y.o.   MRN: [id number]  HPI  Pt in for evaluation.  Pt in states rt upper groin area pain for about 1 month. Pain only when he makes certain movements. Lateral movements and when he lifts leg. At rest no pain. Mostly in morning. At beginning when had pain level was 5-6/10. Pt has taken ibuprofen a couple of times with cool compress.  Over last 3-4 weeks pain decreased now to 2/10 but only with certain movements.  Pt does exercise 2-3 times a week. He feels it when he exercises.    Review of Systems  Constitutional: Negative for chills, fatigue and fever.  Respiratory: Negative for chest tightness, shortness of breath and wheezing.   Cardiovascular: Negative for chest pain and palpitations.  Gastrointestinal: Negative for abdominal pain.  Musculoskeletal:       Rt groin area pain.  Skin: Negative for rash.  Hematological: Negative for adenopathy. Does not bruise/bleed easily.  Psychiatric/Behavioral: Negative for behavioral problems, confusion, dysphoric mood and suicidal ideas. The patient is not nervous/anxious and is not hyperactive.     Past Medical History:  Diagnosis Date  . Esophageal reflux   . Gastric ulcer   . Hypogonadism male   . Seasonal allergies 05/23/2012     Social History   Social History  . Marital status: Married    Spouse name: N/A  . Number of children: 1  . Years of education: N/A   Occupational History  . 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 Smoking status: Never Smoker  . Smokeless tobacco: Never Used  . Alcohol use Yes     Comment: rarely   . Drug use: No  . Sexual activity: Not on file   Other Topics Concern  . Not on file   Social History Narrative   Household-- pt, wife , daughter 90 y/o    Past Surgical History:  Procedure Laterality Date  . NO PAST SURGERIES      Family History  Problem Relation Age of Onset  . CAD  Neg Hx   . Diabetes Neg Hx   . Colon cancer Neg Hx   . Prostate cancer Other        GF dx in his 58    Allergies  Allergen Reactions  . Hydrocodone     Nausea, no rash or itching    Current Outpatient Prescriptions on File Prior to Visit  Medication Sig Dispense Refill  . fluticasone (FLONASE) 50 MCG/ACT nasal spray Place 2 sprays into both nostrils daily. 16 g 1  . NEOMYCIN-POLYMYXIN-HYDROCORTISONE (CORTISPORIN) 1 % SOLN otic solution Place 3 drops into the right ear every 8 (eight) hours. 10 mL 0  . sildenafil (REVATIO) 20 MG tablet Take 3-4 tablets (60-80 mg total) by mouth daily as needed. 30 tablet 5   No current facility-administered medications on file prior to visit.     BP 123/83 (BP Location: Left Arm, Patient Position: Sitting, Cuff Size: Large)   Pulse 64   Temp 98.1 F (36.7 C) (Oral)   Resp 16   Ht 6' (1.829 m)   Wt 245 lb 3.2 oz (111.2 kg)   SpO2 100%   BMI 33.26 kg/m       Objective:   Physical Exam  General- No acute distress. Pleasant patient. Neck- Full range of motion, no jvd Lungs- Clear, even and unlabored. Heart- regular rate  and rhythm. Neurologic- CNII- XII grossly intact.  Rt hip- pain on movement of rt lower leg medially in upper groin. Pain upper groin on lifting  rt leg.  Genital exam- testicles non-tender and normal. Inguinal canals clear for hernia.  Abdomen- soft, non-tender, non-distended, +bs, no rebound or guarding. No rt lower quadrant appendix pain.        Assessment & Plan:  For rt groin pain will treat as if muscle strain but get xray of the hip.  Rx diclofenac nsaid.  Warm compresess twice a day.  Will see how you do over next week. If pain does not taper off then will refer to sports med.  I don't think hernia presently.  Follow up in 1 week or as needed  Alyria Krack, Ramon DredgeEdward, VF CorporationPA-C

## 2016-12-25 NOTE — Patient Instructions (Addendum)
For rt groin pain will treat as if muscle strain but get xray of the hip.  Rx diclofenac nsaid.  Warm compresess twice a day.  Will see how you do over next week. If pain does not taper off then will refer to sports med.  I don't think hernia presently.(I think this would be unlikely)  Follow up in 1 week or as needed

## 2017-09-10 ENCOUNTER — Other Ambulatory Visit: Payer: Self-pay | Admitting: Endocrinology

## 2017-09-10 ENCOUNTER — Other Ambulatory Visit: Payer: Self-pay | Admitting: Internal Medicine

## 2017-09-10 NOTE — Telephone Encounter (Signed)
Last ov 03/26/15 no future scheduled refill or refuse please advise

## 2017-09-10 NOTE — Telephone Encounter (Signed)
No refill until ov 

## 2017-09-22 ENCOUNTER — Encounter: Payer: Self-pay | Admitting: Internal Medicine

## 2017-09-22 MED ORDER — SILDENAFIL CITRATE 20 MG PO TABS: 60.0000 mg | ORAL_TABLET | Freq: Every day | ORAL | 0 refills | Status: DC | PRN

## 2017-09-25 ENCOUNTER — Other Ambulatory Visit: Payer: Self-pay

## 2017-10-26 ENCOUNTER — Encounter: Payer: Self-pay | Admitting: Internal Medicine

## 2017-10-27 NOTE — Telephone Encounter (Signed)
Called pt and LVM advising him to call back and schedule his CPE .

## 2017-10-28 ENCOUNTER — Other Ambulatory Visit: Payer: Self-pay | Admitting: Internal Medicine

## 2017-12-09 ENCOUNTER — Other Ambulatory Visit: Payer: Self-pay | Admitting: Internal Medicine

## 2017-12-21 ENCOUNTER — Ambulatory Visit (INDEPENDENT_AMBULATORY_CARE_PROVIDER_SITE_OTHER): Payer: BC Managed Care – PPO | Admitting: Internal Medicine

## 2017-12-21 ENCOUNTER — Encounter: Payer: Self-pay | Admitting: Internal Medicine

## 2017-12-21 VITALS — BP 122/72 | HR 67 | Temp 98.3°F | Resp 16 | Ht 72.0 in | Wt 250.2 lb

## 2017-12-21 DIAGNOSIS — Z114 Encounter for screening for human immunodeficiency virus [HIV]: Secondary | ICD-10-CM

## 2017-12-21 DIAGNOSIS — Z0000 Encounter for general adult medical examination without abnormal findings: Secondary | ICD-10-CM | POA: Diagnosis not present

## 2017-12-21 DIAGNOSIS — Z23 Encounter for immunization: Secondary | ICD-10-CM

## 2017-12-21 NOTE — Progress Notes (Signed)
Subjective:    Patient ID: Tyrone Freeman, male    DOB: [birth year], 47 y.o.   MRN: [id number]  DOS:  12/21/2017 Type of visit - description : cpx Interval history: Since the last visit he is feeling well, no concerns.  He is trying to stay active, goes to the gym and exercise on average twice a week.  Wt Readings from Last 3 Encounters:  12/21/17 250 lb 4 oz (113.5 kg)  12/25/16 245 lb 3.2 oz (111.2 kg)  11/25/16 248 lb 6.4 oz (112.7 kg)    Review of Systems  A 14 point review of systems is negative    Past Medical History:  Diagnosis Date  . Esophageal reflux   . Gastric ulcer   . Hypogonadism male   . Seasonal allergies 05/23/2012    Past Surgical History:  Procedure Laterality Date  . NO PAST SURGERIES      Social History   Socioeconomic History  . Marital status: Married    Spouse name: Not on file  . Number of children: 1  . Years of education: Not on file  . Highest education level: Not on file  Occupational History  . Occupation: Contractoradministrator [employer]  Social Needs  . Financial resource strain: Not on file  . Food insecurity:    Worry: Not on file    Inability: Not on file  . Transportation needs:    Medical: Not on file    Non-medical: Not on file  Tobacco Use  . Smoking status: Never Smoker  . Smokeless tobacco: Never Used  Substance and Sexual Activity  . Alcohol use: Yes    Comment: rarely   . Drug use: No  . Sexual activity: Not on file  Lifestyle  . Physical activity:    Days per week: Not on file    Minutes per session: Not on file  . Stress: Not on file  Relationships  . Social connections:    Talks on phone: Not on file    Gets together: Not on file    Attends religious service: Not on file    Active member of club or organization: Not on file    Attends meetings of clubs or organizations: Not on file    Relationship status: Not on file  . Intimate partner violence:    Fear of current or ex partner: Not on file   Emotionally abused: Not on file    Physically abused: Not on file    Forced sexual activity: Not on file  Other Topics Concern  . Not on file  Social History Narrative   Household-- pt, wife , daughter  2002     Family History  Problem Relation Age of Onset  . Prostate cancer Maternal Grandfather 80  . CAD Neg Hx   . Diabetes Neg Hx   . Colon cancer Neg Hx      Allergies as of 12/21/2017      Reactions   Hydrocodone    Nausea, no rash or itching      Medication List    as of 12/21/2017 11:59 PM   You have not been prescribed any medications.        Objective:   Physical Exam BP 122/72 (BP Location: Left Arm, Patient Position: Sitting, Cuff Size: Normal)   Pulse 67   Temp 98.3 F (36.8 C) (Oral)   Resp 16   Ht 6' (1.829 m)   Wt 250 lb 4 oz (113.5 kg)  SpO2 98%   BMI 33.94 kg/m  General: Well developed, NAD, see BMI.  Neck: No  thyromegaly  HEENT:  Normocephalic . Face symmetric, atraumatic Lungs:  CTA B Normal respiratory effort, no intercostal retractions, no accessory muscle use. Heart: RRR,  no murmur.  No pretibial edema bilaterally  Abdomen:  Not distended, soft, non-tender. No rebound or rigidity.  Rectal: External abnormalities: none. Normal sphincter tone. No rectal masses or tenderness.  No stools found Prostate: Prostate gland very difficult to reach, what I felt was normal however. Skin: Exposed areas without rash. Not pale. Not jaundice Neurologic:  alert & oriented X3.  Speech normal, gait appropriate for age and unassisted Strength symmetric and appropriate for age.  Psych: Cognition and judgment appear intact.  Cooperative with normal attention span and concentration.  Behavior appropriate. No anxious or depressed appearing.     Assessment & Plan:  Assessment GERD, h/o gastric ulcer H/o hypogonadism Mild, central hypogonadism, saw endo 2014, was rx p Clomid , took x 1 week, no s/e. Brain MRI was  not warranted  Seasonal  allergies   PLAN Here for CPX, doing well. RTC 1 year

## 2017-12-21 NOTE — Assessment & Plan Note (Signed)
--  Td 11/2017 --cscope-- never   --Prostate cancer screening:  Pt is AA, GF had prostate cancer, DRE today very limited.  Check a PSA.  --Diet-exercise :   He is trying to do better, praised  --Labs: Will come back fasting CMP, FLP, CBC, TSH, TSH, HIV

## 2017-12-21 NOTE — Patient Instructions (Signed)
   GO TO THE FRONT DESK Schedule labs to be done fasting this week   Schedule your next appointment for a physical exam in 1 year    Continue watching your diet.  Stay active.

## 2017-12-21 NOTE — Progress Notes (Signed)
Pre visit review using our clinic review tool, if applicable. No additional management support is needed unless otherwise documented below in the visit note. 

## 2017-12-22 ENCOUNTER — Other Ambulatory Visit (INDEPENDENT_AMBULATORY_CARE_PROVIDER_SITE_OTHER): Payer: BC Managed Care – PPO

## 2017-12-22 DIAGNOSIS — Z0000 Encounter for general adult medical examination without abnormal findings: Secondary | ICD-10-CM | POA: Diagnosis not present

## 2017-12-22 DIAGNOSIS — Z114 Encounter for screening for human immunodeficiency virus [HIV]: Secondary | ICD-10-CM | POA: Diagnosis not present

## 2017-12-22 LAB — CBC WITH DIFFERENTIAL/PLATELET
BASOS PCT: 0.6 % (ref 0.0–3.0)
Basophils Absolute: 0 10*3/uL (ref 0.0–0.1)
EOS PCT: 6.1 % — AB (ref 0.0–5.0)
Eosinophils Absolute: 0.3 10*3/uL (ref 0.0–0.7)
HCT: 45.8 % (ref 39.0–52.0)
HEMOGLOBIN: 15.9 g/dL (ref 13.0–17.0)
LYMPHS PCT: 26 % (ref 12.0–46.0)
Lymphs Abs: 1.2 10*3/uL (ref 0.7–4.0)
MCHC: 34.7 g/dL (ref 30.0–36.0)
MCV: 87 fl (ref 78.0–100.0)
Monocytes Absolute: 0.3 10*3/uL (ref 0.1–1.0)
Monocytes Relative: 5.6 % (ref 3.0–12.0)
Neutro Abs: 2.8 10*3/uL (ref 1.4–7.7)
Neutrophils Relative %: 61.7 % (ref 43.0–77.0)
Platelets: 183 10*3/uL (ref 150.0–400.0)
RBC: 5.26 Mil/uL (ref 4.22–5.81)
RDW: 13.8 % (ref 11.5–15.5)
WBC: 4.5 10*3/uL (ref 4.0–10.5)

## 2017-12-22 LAB — LIPID PANEL
CHOL/HDL RATIO: 4
Cholesterol: 203 mg/dL — ABNORMAL HIGH (ref 0–200)
HDL: 51.6 mg/dL (ref >39.00)
LDL CALC: 134 mg/dL — AB (ref 0–99)
NonHDL: 151.68
Triglycerides: 86 mg/dL (ref 0.0–149.0)
VLDL: 17.2 mg/dL (ref 0.0–40.0)

## 2017-12-22 LAB — COMPREHENSIVE METABOLIC PANEL
ALBUMIN: 4.3 g/dL (ref 3.5–5.2)
ALK PHOS: 60 U/L (ref 39–117)
ALT: 35 U/L (ref 0–53)
AST: 22 U/L (ref 0–37)
BUN: 14 mg/dL (ref 6–23)
CHLORIDE: 104 meq/L (ref 96–112)
CO2: 29 meq/L (ref 19–32)
CREATININE: 1.38 mg/dL (ref 0.40–1.50)
Calcium: 9.7 mg/dL (ref 8.4–10.5)
GFR: 71.07 mL/min (ref >60.00)
Glucose, Bld: 99 mg/dL (ref 70–99)
POTASSIUM: 4.4 meq/L (ref 3.5–5.1)
SODIUM: 140 meq/L (ref 135–145)
Total Bilirubin: 1 mg/dL (ref 0.2–1.2)
Total Protein: 7.2 g/dL (ref 6.0–8.3)

## 2017-12-22 LAB — PSA: PSA: 0.81 ng/mL (ref 0.10–4.00)

## 2017-12-22 LAB — TSH: TSH: 1.32 u[IU]/mL (ref 0.35–4.50)

## 2017-12-22 NOTE — Assessment & Plan Note (Signed)
Here for CPX, doing well RTC 1 year 

## 2017-12-23 LAB — HIV ANTIBODY (ROUTINE TESTING W REFLEX): HIV 1&2 Ab, 4th Generation: NONREACTIVE

## 2018-05-10 ENCOUNTER — Telehealth: Payer: BC Managed Care – PPO | Admitting: Family

## 2018-05-10 DIAGNOSIS — R059 Cough, unspecified: Secondary | ICD-10-CM

## 2018-05-10 DIAGNOSIS — R05 Cough: Secondary | ICD-10-CM

## 2018-05-10 MED ORDER — BENZONATATE 100 MG PO CAPS: 100.0000 mg | ORAL_CAPSULE | Freq: Three times a day (TID) | ORAL | 0 refills | Status: DC | PRN

## 2018-05-10 MED ORDER — PREDNISONE 10 MG (21) PO TBPK: ORAL_TABLET | ORAL | 0 refills | Status: DC

## 2018-05-10 NOTE — Progress Notes (Signed)
We are sorry that you are not feeling well.  Here is how we plan to help!  Based on your presentation I believe you most likely have A cough due to a virus.  This is called viral bronchitis and is best treated by rest, plenty of fluids and control of the cough.  You may use Ibuprofen or Tylenol as directed to help your symptoms.     In addition you may use A non-prescription cough medication called Robitussin DAC. Take 2 teaspoons every 8 hours or Delsym: take 2 teaspoons every 12 hours., A non-prescription cough medication called Mucinex DM: take 2 tablets every 12 hours. and A prescription cough medication called Tessalon Perles 100mg. You may take 1-2 capsules every 8 hours as needed for your cough.  Prednisone 10 mg daily for 6 days (see taper instructions below)  Directions for 6 day taper: Day 1: 2 tablets before breakfast, 1 after both lunch & dinner and 2 at bedtime Day 2: 1 tab before breakfast, 1 after both lunch & dinner and 2 at bedtime Day 3: 1 tab at each meal & 1 at bedtime Day 4: 1 tab at breakfast, 1 at lunch, 1 at bedtime Day 5: 1 tab at breakfast & 1 tab at bedtime Day 6: 1 tab at breakfast   From your responses in the eVisit questionnaire you describe inflammation in the upper respiratory tract which is causing a significant cough.  This is commonly called Bronchitis and has four common causes:    Allergies  Viral Infections  Acid Reflux  Bacterial Infection Allergies, viruses and acid reflux are treated by controlling symptoms or eliminating the cause. An example might be a cough caused by taking certain blood pressure medications. You stop the cough by changing the medication. Another example might be a cough caused by acid reflux. Controlling the reflux helps control the cough.  USE OF BRONCHODILATOR ("RESCUE") INHALERS: There is a risk from using your bronchodilator too frequently.  The risk is that over-reliance on a medication which only relaxes the muscles  surrounding the breathing tubes can reduce the effectiveness of medications prescribed to reduce swelling and congestion of the tubes themselves.  Although you feel brief relief from the bronchodilator inhaler, your asthma may actually be worsening with the tubes becoming more swollen and filled with mucus.  This can delay other crucial treatments, such as oral steroid medications. If you need to use a bronchodilator inhaler daily, several times per day, you should discuss this with your provider.  There are probably better treatments that could be used to keep your asthma under control.     HOME CARE . Only take medications as instructed by your medical team. . Complete the entire course of an antibiotic. . Drink plenty of fluids and get plenty of rest. . Avoid close contacts especially the very young and the elderly . Cover your mouth if you cough or cough into your sleeve. . Always remember to wash your hands . A steam or ultrasonic humidifier can help congestion.   GET HELP RIGHT AWAY IF: . You develop worsening fever. . You become short of breath . You cough up blood. . Your symptoms persist after you have completed your treatment plan MAKE SURE YOU   Understand these instructions.  Will watch your condition.  Will get help right away if you are not doing well or get worse.  Your e-visit answers were reviewed by a board certified advanced clinical practitioner to complete your personal care plan.    Depending on the condition, your plan could have included both over the counter or prescription medications. If there is a problem please reply  once you have received a response from your provider. Your safety is important to us.  If you have drug allergies check your prescription carefully.    You can use MyChart to ask questions about today's visit, request a non-urgent call back, or ask for a work or school excuse for 24 hours related to this e-Visit. If it has been greater than 24  hours you will need to follow up with your provider, or enter a new e-Visit to address those concerns. You will get an e-mail in the next two days asking about your experience.  I hope that your e-visit has been valuable and will speed your recovery. Thank you for using e-visits.   

## 2018-06-21 ENCOUNTER — Encounter: Payer: Self-pay | Admitting: Internal Medicine

## 2018-08-02 ENCOUNTER — Other Ambulatory Visit: Payer: Self-pay

## 2018-08-02 ENCOUNTER — Other Ambulatory Visit: Payer: Self-pay | Admitting: Internal Medicine

## 2018-08-02 ENCOUNTER — Encounter: Payer: Self-pay | Admitting: Internal Medicine

## 2018-08-02 DIAGNOSIS — R059 Cough, unspecified: Secondary | ICD-10-CM

## 2018-08-02 DIAGNOSIS — R05 Cough: Secondary | ICD-10-CM

## 2018-08-02 MED ORDER — SILDENAFIL CITRATE 20 MG PO TABS: 60.0000 mg | ORAL_TABLET | Freq: Every day | ORAL | 5 refills | Status: DC | PRN

## 2018-08-02 NOTE — Telephone Encounter (Signed)
Left message- NTBS for refill 

## 2018-12-24 ENCOUNTER — Encounter: Payer: BC Managed Care – PPO | Admitting: Internal Medicine

## 2018-12-29 ENCOUNTER — Other Ambulatory Visit: Payer: Self-pay | Admitting: Internal Medicine

## 2019-01-25 ENCOUNTER — Encounter: Payer: Self-pay | Admitting: Internal Medicine

## 2019-04-01 ENCOUNTER — Encounter: Payer: BC Managed Care – PPO | Admitting: Internal Medicine

## 2019-04-14 ENCOUNTER — Other Ambulatory Visit: Payer: Self-pay

## 2019-04-15 ENCOUNTER — Encounter: Payer: Self-pay | Admitting: Internal Medicine

## 2019-04-15 ENCOUNTER — Other Ambulatory Visit: Payer: Self-pay

## 2019-04-15 ENCOUNTER — Ambulatory Visit (INDEPENDENT_AMBULATORY_CARE_PROVIDER_SITE_OTHER): Payer: BC Managed Care – PPO | Admitting: Internal Medicine

## 2019-04-15 VITALS — BP 125/87 | HR 62 | Temp 97.2°F | Resp 16 | Ht 72.0 in | Wt 269.4 lb

## 2019-04-15 DIAGNOSIS — K219 Gastro-esophageal reflux disease without esophagitis: Secondary | ICD-10-CM | POA: Diagnosis not present

## 2019-04-15 DIAGNOSIS — Z0189 Encounter for other specified special examinations: Secondary | ICD-10-CM

## 2019-04-15 DIAGNOSIS — Z0000 Encounter for general adult medical examination without abnormal findings: Secondary | ICD-10-CM

## 2019-04-15 MED ORDER — PANTOPRAZOLE SODIUM 40 MG PO TBEC: 40.0000 mg | DELAYED_RELEASE_TABLET | Freq: Every day | ORAL | 3 refills | Status: DC

## 2019-04-15 NOTE — Patient Instructions (Addendum)
GO TO THE LAB : Get the blood work     GO TO THE FRONT DESK Schedule your next appointment   for a physical exam in 1 year  Improve your diet: Calorie counting?  Weight watchers? NOOM?  Exercise, 3 hours weekly   Pantoprazole for 6 weeks, if you are not better please let me know  Food Choices for Gastroesophageal Reflux Disease, Adult When you have gastroesophageal reflux disease (GERD), the foods you eat and your eating habits are very important. Choosing the right foods can help ease the discomfort of GERD. Consider working with a diet and nutrition specialist (dietitian) to help you make healthy food choices. What general guidelines should I follow?  Eating plan  Choose healthy foods low in fat, such as fruits, vegetables, whole grains, low-fat dairy products, and lean meat, fish, and poultry.  Eat frequent, small meals instead of three large meals each day. Eat your meals slowly, in a relaxed setting. Avoid bending over or lying down until 2-3 hours after eating.  Limit high-fat foods such as fatty meats or fried foods.  Limit your intake of oils, butter, and shortening to less than 8 teaspoons each day.  Avoid the following: ? Foods that cause symptoms. These may be different for different people. Keep a food diary to keep track of foods that cause symptoms. ? Alcohol. ? Drinking large amounts of liquid with meals. ? Eating meals during the 2-3 hours before bed.  Cook foods using methods other than frying. This may include baking, grilling, or broiling. Lifestyle  Maintain a healthy weight. Ask your health care provider what weight is healthy for you. If you need to lose weight, work with your health care provider to do so safely.  Exercise for at least 30 minutes on 5 or more days each week, or as told by your health care provider.  Avoid wearing clothes that fit tightly around your waist and chest.  Do not use any products that contain nicotine or tobacco, such as  cigarettes and e-cigarettes. If you need help quitting, ask your health care provider.  Sleep with the head of your bed raised. Use a wedge under the mattress or blocks under the bed frame to raise the head of the bed. What foods are not recommended? The items listed may not be a complete list. Talk with your dietitian about what dietary choices are best for you. Grains Pastries or quick breads with added fat. [place] toast. Vegetables Deep fried vegetables. [place] fries. Any vegetables prepared with added fat. Any vegetables that cause symptoms. For some people this may include tomatoes and tomato products, chili peppers, onions and garlic, and horseradish. Fruits Any fruits prepared with added fat. Any fruits that cause symptoms. For some people this may include citrus fruits, such as oranges, grapefruit, pineapple, and lemons. Meats and other protein foods High-fat meats, such as fatty beef or pork, hot dogs, ribs, ham, sausage, salami and bacon. Fried meat or protein, including fried fish and fried chicken. Nuts and nut butters. Dairy Whole milk and chocolate milk. Sour cream. Cream. Ice cream. Cream cheese. Milk shakes. Beverages Coffee and tea, with or without caffeine. Carbonated beverages. Sodas. Energy drinks. Fruit juice made with acidic fruits (such as orange or grapefruit). Tomato juice. Alcoholic drinks. Fats and oils Butter. Margarine. Shortening. Ghee. Sweets and desserts Chocolate and cocoa. Donuts. Seasoning and other foods Pepper. Peppermint and spearmint. Any condiments, herbs, or seasonings that cause symptoms. For some people, this may include curry, hot  sauce, or vinegar-based salad dressings. Summary  When you have gastroesophageal reflux disease (GERD), food and lifestyle choices are very important to help ease the discomfort of GERD.  Eat frequent, small meals instead of three large meals each day. Eat your meals slowly, in a relaxed setting. Avoid bending over or  lying down until 2-3 hours after eating.  Limit high-fat foods such as fatty meat or fried foods. This information is not intended to replace advice given to you by your health care provider. Make sure you discuss any questions you have with your health care provider. Document Released: 06/16/2005 Document Revised: 10/07/2018 Document Reviewed: 06/17/2016 Elsevier Patient Education  2020 [employer].

## 2019-04-15 NOTE — Assessment & Plan Note (Signed)
-   Td 11/2017 - flu shot : benefits d/w pt , declined  --CCS: early check? We agreed on a IFOB --Prostate cancer screening:  Pt is AA, GF had prostate cancer, DRE - PSA wnl 2019 --Diet-exercise: Significant weight gain: We talk about diet, exercise --Labs:  CMP, FLP, CBC, IFOB

## 2019-04-15 NOTE — Progress Notes (Signed)
Pre visit review using our clinic review tool, if applicable. No additional management support is needed unless otherwise documented below in the visit note. 

## 2019-04-15 NOTE — Progress Notes (Addendum)
Subjective:    Patient ID: Tyrone Freeman, male    DOB: 1971/03/31, 48 y.o.   MRN: [id number]  DOS:  04/15/2019 Type of visit - description: CPX Concerned about snoring, this is a chronic issue, worse in the last 6 months, occasionally tired in the morning. GERD: Symptoms has returned, + heartburn, occasionally he hurts when he swallows.  No dysphagia.  No nausea, vomiting or blood in the stools. Occasional cough in the last week, denies fever chills, no URI symptoms, sometimes cough is worse after eating.   Wt Readings from Last 3 Encounters:  04/15/19 269 lb 6 oz (122.2 kg)  12/21/17 250 lb 4 oz (113.5 kg)  12/25/16 245 lb 3.2 oz (111.2 kg)   Review of Systems  Other than above, a 14 point review of systems is negative      Past Medical History:  Diagnosis Date  . Esophageal reflux   . Gastric ulcer   . Hypogonadism male   . Seasonal allergies 05/23/2012    Past Surgical History:  Procedure Laterality Date  . NO PAST SURGERIES      Social History   Socioeconomic History  . Marital status: Married    Spouse name: Not on file  . Number of children: 1  . Years of education: Not on file  . Highest education level: Not on file  Occupational History  . Occupation: Contractor, [employer]  Social Needs  . Financial resource strain: Not on file  . Food insecurity    Worry: Not on file    Inability: Not on file  . Transportation needs    Medical: Not on file    Non-medical: Not on file  Tobacco Use  . Smoking status: Never Smoker  . Smokeless tobacco: Never Used  Substance and Sexual Activity  . Alcohol use: Yes    Comment: rarely   . Drug use: No  . Sexual activity: Not on file  Lifestyle  . Physical activity    Days per week: Not on file    Minutes per session: Not on file  . Stress: Not on file  Relationships  . Social Musician on phone: Not on file    Gets together: Not on file    Attends religious service: Not on file   Active member of club or organization: Not on file    Attends meetings of clubs or organizations: Not on file    Relationship status: Not on file  . Intimate partner violence    Fear of current or ex partner: Not on file    Emotionally abused: Not on file    Physically abused: Not on file    Forced sexual activity: Not on file  Other Topics Concern  . Not on file  Social History Narrative   Household-- pt, wife , daughter  2002     Family History  Problem Relation Age of Onset  . Prostate cancer Maternal Grandfather 80  . CAD Neg Hx   . Diabetes Neg Hx   . Colon cancer Neg Hx     Allergies as of 04/15/2019      Reactions   Hydrocodone    Nausea, no rash or itching      Medication List       Accurate as of April 15, 2019 11:59 PM. If you have any questions, ask your nurse or doctor.        STOP taking these medications   benzonatate 100 MG  capsule Commonly known as: Best boy Stopped by: Kathlene November, MD   predniSONE 10 MG (21) Tbpk tablet Commonly known as: STERAPRED UNI-PAK 21 TAB Stopped by: Kathlene November, MD   sildenafil 20 MG tablet Commonly known as: REVATIO Stopped by: Kathlene November, MD     TAKE these medications   pantoprazole 40 MG tablet Commonly known as: PROTONIX Take 1 tablet (40 mg total) by mouth daily. Started by: Kathlene November, MD           Objective:   Physical Exam BP 125/87 (BP Location: Left Arm, Patient Position: Sitting, Cuff Size: Normal)   Pulse 62   Temp (!) 97.2 F (36.2 C) (Temporal)   Resp 16   Ht 6' (1.829 m)   Wt 269 lb 6 oz (122.2 kg)   SpO2 100%   BMI 36.53 kg/m  General: Well developed, NAD, BMI noted Neck: No  thyromegaly  HEENT:  Normocephalic . Face symmetric, atraumatic Lungs:  CTA B Normal respiratory effort, no intercostal retractions, no accessory muscle use. Heart: RRR,  no murmur.  No pretibial edema bilaterally  Abdomen:  Not distended, soft, non-tender. No rebound or rigidity.   Skin: Exposed areas  without rash. Not pale. Not jaundice Neurologic:  alert & oriented X3.  Speech normal, gait appropriate for age and unassisted Strength symmetric and appropriate for age.  Psych: Cognition and judgment appear intact.  Cooperative with normal attention span and concentration.  Behavior appropriate. No anxious or depressed appearing.     Assessment    ASSESSMENT GERD, h/o gastric ulcer H/o hypogonadism Mild, central hypogonadism, saw endo 2014, was rx p Clomid , took x 1 week, no s/e. Brain MRI was  not warranted  Seasonal allergies ED   PLAN Here for CPX GERD: Not on PPIs, symptoms have returned including mild odynophagia.  Will restart pantoprazole, rec to modify diet to prevent sxs, rec to loose weight. If sxs persist  particularly odynophagia, will call for a referral EKG: NSR, no acute changes Cough: Call if not better in few days after GERD treatment ED: Not an issue at this point.  Will call for Viagra as needed Snoring: Epworth scale: 9, barely positive ; likes further eval, refer to neuro RTC CPX 1 year

## 2019-04-17 NOTE — Assessment & Plan Note (Addendum)
Here for CPX GERD: Not on PPIs, symptoms have returned including mild odynophagia.  Will restart pantoprazole, rec to modify diet to prevent sxs, rec to loose weight. If sxs persist  particularly odynophagia, will call for a referral EKG: NSR, no acute changes Cough: Call if not better in few days after GERD treatment ED: Not an issue at this point.  Will call for Viagra as needed Snoring: Epworth scale: 9, barely positive ; likes further eval, refer to neuro RTC CPX 1 year

## 2019-04-20 ENCOUNTER — Other Ambulatory Visit (INDEPENDENT_AMBULATORY_CARE_PROVIDER_SITE_OTHER): Payer: BC Managed Care – PPO

## 2019-04-20 ENCOUNTER — Other Ambulatory Visit: Payer: Self-pay

## 2019-04-20 DIAGNOSIS — Z0000 Encounter for general adult medical examination without abnormal findings: Secondary | ICD-10-CM | POA: Diagnosis not present

## 2019-04-20 LAB — COMPREHENSIVE METABOLIC PANEL
ALT: 33 U/L (ref 0–53)
AST: 23 U/L (ref 0–37)
Albumin: 4.2 g/dL (ref 3.5–5.2)
Alkaline Phosphatase: 61 U/L (ref 39–117)
BUN: 12 mg/dL (ref 6–23)
CO2: 28 mEq/L (ref 19–32)
Calcium: 9.6 mg/dL (ref 8.4–10.5)
Chloride: 102 mEq/L (ref 96–112)
Creatinine, Ser: 1.41 mg/dL (ref 0.40–1.50)
GFR: 64.86 mL/min (ref >60.00)
Glucose, Bld: 96 mg/dL (ref 70–99)
Potassium: 4.5 mEq/L (ref 3.5–5.1)
Sodium: 137 mEq/L (ref 135–145)
Total Bilirubin: 0.7 mg/dL (ref 0.2–1.2)
Total Protein: 7.2 g/dL (ref 6.0–8.3)

## 2019-04-20 LAB — CBC WITH DIFFERENTIAL/PLATELET
Basophils Absolute: 0 10*3/uL (ref 0.0–0.1)
Basophils Relative: 0.4 % (ref 0.0–3.0)
Eosinophils Absolute: 0.4 10*3/uL (ref 0.0–0.7)
Eosinophils Relative: 7.1 % — ABNORMAL HIGH (ref 0.0–5.0)
HCT: 45 % (ref 39.0–52.0)
Hemoglobin: 15.5 g/dL (ref 13.0–17.0)
Lymphocytes Relative: 30.1 % (ref 12.0–46.0)
Lymphs Abs: 1.6 10*3/uL (ref 0.7–4.0)
MCHC: 34.5 g/dL (ref 30.0–36.0)
MCV: 87.1 fl (ref 78.0–100.0)
Monocytes Absolute: 0.4 10*3/uL (ref 0.1–1.0)
Monocytes Relative: 6.8 % (ref 3.0–12.0)
Neutro Abs: 2.9 10*3/uL (ref 1.4–7.7)
Neutrophils Relative %: 55.6 % (ref 43.0–77.0)
Platelets: 190 10*3/uL (ref 150.0–400.0)
RBC: 5.17 Mil/uL (ref 4.22–5.81)
RDW: 13.8 % (ref 11.5–15.5)
WBC: 5.2 10*3/uL (ref 4.0–10.5)

## 2019-04-20 LAB — LIPID PANEL
Cholesterol: 192 mg/dL (ref 0–200)
HDL: 44.4 mg/dL (ref >39.00)
LDL Cholesterol: 126 mg/dL — ABNORMAL HIGH (ref 0–99)
NonHDL: 147.47
Total CHOL/HDL Ratio: 4
Triglycerides: 105 mg/dL (ref 0.0–149.0)
VLDL: 21 mg/dL (ref 0.0–40.0)

## 2019-04-22 LAB — FECAL OCCULT BLOOD, IMMUNOCHEMICAL: Fecal Occult Bld: NEGATIVE

## 2019-04-28 ENCOUNTER — Other Ambulatory Visit: Payer: Self-pay

## 2019-04-28 ENCOUNTER — Ambulatory Visit: Payer: BC Managed Care – PPO | Admitting: Neurology

## 2019-04-28 ENCOUNTER — Encounter: Payer: Self-pay | Admitting: Neurology

## 2019-04-28 VITALS — BP 131/87 | HR 64 | Temp 98.5°F | Ht 72.0 in | Wt 272.0 lb

## 2019-04-28 DIAGNOSIS — J302 Other seasonal allergic rhinitis: Secondary | ICD-10-CM | POA: Diagnosis not present

## 2019-04-28 DIAGNOSIS — R0681 Apnea, not elsewhere classified: Secondary | ICD-10-CM

## 2019-04-28 DIAGNOSIS — K219 Gastro-esophageal reflux disease without esophagitis: Secondary | ICD-10-CM | POA: Diagnosis not present

## 2019-04-28 DIAGNOSIS — R0683 Snoring: Secondary | ICD-10-CM | POA: Diagnosis not present

## 2019-04-28 NOTE — Patient Instructions (Signed)

## 2019-04-28 NOTE — Progress Notes (Signed)
SLEEP MEDICINE CLINIC    Provider:  Melvyn Novas, MD  Primary Care Physician:  Wanda Plump, MD [address] [place] [address]     Referring Provider: Wanda Plump, Md [address] [place],  [place] [address]          Chief Complaint according to patient   Patient presents with:    . 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 year old African American male patient seen  on 10/29/2020Chief concern according to patient :  He endorses snoring. He does not wake up with headaches. He wakes up feeling moderately rested. He has never had a sleep study or consult before. He has been feeling more fatigued, falls asleep in the evenings, has gained some weight.    I have the pleasure of seeing Tyrone Freeman today, a right -handed  African- American male with a possible sleep disorder.   He  has a past medical history of Esophageal reflux, Gastric ulcer, Hypogonadism male, and Seasonal allergies (05/23/2012).  Family medical /sleep history: No other family member on CPAP with OSA, insomnia, no sleep walkers.    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 Family status is married , with one child,.  The patient currently works from his office, not from home. Pets are present- one dog . Tobacco use; never. ETOH use ; socially , Caffeine intake in form of Coffee( rare ) Soda( none) Tea ( rare ) or energy drinks. Regular exercise in form of walking, running since the gym remains closed.     Sleep habits are as follows: The patient's dinner time is between 7-8  PM.  He works on a computer until bedtime. The patient goes to bed at midnight and continues to sleep for 6 hours, wakes only sometimes for one  bathroom break at 4 AM.   The preferred sleep position is supine and he  wakes up on his side, with the support of 1 pillow. Dreams are reportedly frequent. 6.30 AM is the usual rise time. The patient wakes up with an alarm.  He reports feeling  OK - most mornings refreshed /restored in AM. Naps are taken on weekends, sometimes before bedtime. Naps  lasting 1 hours and are morerefreshing than nocturnal sleep.    Review of Systems: Out of a complete 14 system review, the patient complains of only the following symptoms, and all other reviewed systems are negative.:  Fatigue, sleepiness , snoring, fragmented sleep,   How likely are you to doze in the following situations: 0 = not likely, 1 = slight chance, 2 = moderate chance, 3 = high chance   Sitting and Reading? Watching Television? Sitting inactive in a public place (theater or meeting)? As a passenger in a car for an hour without a break? Lying down in the afternoon when circumstances permit? Sitting and talking to someone? Sitting quietly after lunch without alcohol? In a car, while stopped for a few minutes in traffic?   Total = 7/ 24 points    Social History   Socioeconomic History  . Marital status: Married    Spouse name: Not on file  . Number of  children: 1  . Years of education: Not on file  . Highest education level: Not on file  Occupational History  . Occupation: Contractor, [employer]  Social Needs  . Financial resource strain: Not on file  . Food insecurity    Worry: Not on file    Inability: Not on file  . Transportation needs    Medical: Not on file    Non-medical: Not on file  Tobacco Use  . Smoking status: Never Smoker  . Smokeless tobacco: Never Used  Substance and Sexual Activity  . Alcohol use: Yes    Comment: rarely   . Drug use: No  . Sexual activity: Not on file  Lifestyle  . Physical activity    Days per week: Not on file    Minutes per session: Not on file  . Stress: Not on file  Relationships  . Social Musician on phone: Not on file     Gets together: Not on file    Attends religious service: Not on file    Active member of club or organization: Not on file    Attends meetings of clubs or organizations: Not on file    Relationship status: Not on file  Other Topics Concern  . Not on file  Social History Narrative   Household-- pt, wife , daughter  2002      Right handed   Caffeine: "I only drink coffee maybe once every 2 or 3 weeks"    Family History  Problem Relation Age of Onset  . Prostate cancer Maternal Grandfather 80  . CAD Neg Hx   . Diabetes Neg Hx   . Colon cancer Neg Hx   . Sleep apnea Neg Hx        not that he is aware of     Past Medical History:  Diagnosis Date  . Esophageal reflux   . Gastric ulcer   . Hypogonadism male   . Seasonal allergies 05/23/2012    Past Surgical History:  Procedure Laterality Date  . NO PAST SURGERIES       Current Outpatient Medications on File Prior to Visit  Medication Sig Dispense Refill  . pantoprazole (PROTONIX) 40 MG tablet Take 1 tablet (40 mg total) by mouth daily. 30 tablet 3   No current facility-administered medications on file prior to visit.     Allergies  Allergen Reactions  . Hydrocodone     Nausea, no rash or itching    Physical exam:  Today's Vitals   04/28/19 1133  BP: 131/87  Pulse: 64  Temp: 98.5 F (36.9 C)  Weight: 272 lb (123.4 kg)  Height: 6' (1.829 m)   Body mass index is 36.89 kg/m.   Wt Readings from Last 3 Encounters:  04/28/19 272 lb (123.4 kg)  04/15/19 269 lb 6 oz (122.2 kg)  12/21/17 250 lb 4 oz (113.5 kg)     Ht Readings from Last 3 Encounters:  04/28/19 6' (1.829 m)  04/15/19 6' (1.829 m)  12/21/17 6' (1.829 m)      General: The patient is awake, alert and appears not in acute distress. The patient is well groomed. Head: Normocephalic, atraumatic. Neck is supple. Mallampati 3,  neck circumference: 17. 75 inches . Nasal airflow  patent.  Retrognathia is  seen.  Dental status: intact   Cardiovascular:  Regular rate and cardiac rhythm by pulse,  without distended neck veins. Respiratory: Lungs are clear to auscultation.  Skin:  Without  evidence of ankle edema, or rash. Trunk: The patient's posture is erect.   Neurologic exam : The patient is awake and alert, oriented to place and time.   Memory subjective described as intact.  Attention span & concentration ability appears normal.  Speech is fluent,  without  dysarthria, dysphonia or aphasia.  Mood and affect are appropriate.   Cranial nerves: no loss of smell or taste reported  Pupils are equal and briskly reactive to light. Funduscopic exam deferred.  Extraocular movements in vertical and horizontal planes were intact and without nystagmus. No Diplopia. Visual fields by finger perimetry are intact. Hearing was intact to soft voice and finger rubbing.    Facial sensation intact to fine touch.  Facial motor strength is symmetric and tongue and uvula move midline.  Neck ROM : rotation, tilt and flexion extension were normal for age and shoulder shrug was symmetrical.    Motor exam:  Symmetric bulk, tone and ROM.   Normal tone without cog wheeling, symmetric grip strength .   Sensory:  Fine touch, pinprick and vibration were tested  and  normal.  Proprioception tested in the upper extremities was normal.   Coordination: Rapid alternating movements in the fingers/hands were of normal speed.  The Finger-to-nose maneuver was intact without evidence of ataxia, dysmetria or tremor.   Gait and station: Patient could rise unassisted from a seated position, walked without assistive device.  Toe and heel walk were deferred.  Deep tendon reflexes: in the  upper and lower extremities are symmetric and intact.  Babinski response was deferred .       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  Morning headaches are not present and he denies having a dry mouth.  He feels usually moderately rested in the morning.  During the pandemic he has gained some weight mainly because he could not follow his usual exercise regimen.  His current BMI is 36.8. He has retrognathia and moderately narrow upper airway and neck size.      My Plan is to proceed with:  1) I feel that the patient could be sufficiently served with a home sleep test.  We are not addressing restless legs nocturnal seizures nightmares etc. he has no history of parasomnia.  Or main goal is to screen for sleep apnea and a home sleep test would be a sufficient device.  I will order the home sleep test through our sleep lab, the patient will be invited by phone to pick up the device and then to return at after using it overnight.  Instructions on how to use the device will be given here.  We will use and watch pat device.  If the test returns positive for sleep apnea we will follow-up discussing treatment options, if there is likely a result that deems to only 1 possible treatment form I will initiate that treatment first and then follow-up to see the results.  Should there be no sleep apnea present we do not necessarily have to follow-up.  I  Will leave it then  up to the patient.   I would like to thank Colon Branch, MD [address] [place],  [place] [address] for allowing me to meet with and to take care of this pleasant patient.   In short, Cadence C Freeman is presenting with snoring . I plan to follow up either personally or through our NP within 2-3 month.   CC: I will share my notes with PCP   Electronically signed by: Larey Seat, MD 04/28/2019 11:51 AM  [facility] certified by The [employer] of Sleep Medicine and Diplomate of the [employer] of Sleep Medicine. Board certified In Neurology through the [place], Fellow of the [employer] of Neurology. Medical Director of [employer].

## 2019-05-04 ENCOUNTER — Encounter: Payer: Self-pay | Admitting: Internal Medicine

## 2019-05-18 ENCOUNTER — Ambulatory Visit: Payer: BC Managed Care – PPO | Admitting: Neurology

## 2019-05-18 DIAGNOSIS — R0683 Snoring: Secondary | ICD-10-CM

## 2019-05-18 DIAGNOSIS — K219 Gastro-esophageal reflux disease without esophagitis: Secondary | ICD-10-CM

## 2019-05-18 DIAGNOSIS — G4733 Obstructive sleep apnea (adult) (pediatric): Secondary | ICD-10-CM

## 2019-05-18 DIAGNOSIS — J302 Other seasonal allergic rhinitis: Secondary | ICD-10-CM

## 2019-06-06 ENCOUNTER — Telehealth: Payer: Self-pay | Admitting: Neurology

## 2019-06-06 NOTE — Addendum Note (Signed)
Addended by: Larey Seat on: 06/06/2019 09:08 AM   Modules accepted: Orders

## 2019-06-06 NOTE — Telephone Encounter (Signed)
Called patient to discuss sleep study results. No answer at this time. LVM for the patient to call back.   

## 2019-06-06 NOTE — Procedures (Addendum)
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 He does not wake up with headaches. He wakes up feeling moderately rested. He has never had a sleep study or consult before. He has been feeling more fatigued, falls asleep in the evenings, has gained some weight. He has a past medical history of Esophageal reflux, Gastric ulcer, low testosterone levels, and Seasonal allergies (05/23/2012).      Summary & Diagnosis:    Moderate - severe obstructive sleep apnea at AHI 28.3/h and during REM sleep exacerbated to 49/h.  The RDI indicates snoring to be present, reaching over 60 decibels volume.     Recommendations:     Obstructive and REM sleep depended apnea will require treatment by positive airway pressure. I will order an autotitration CPAP device between 6 and 16 cm water pressure with heated humidity and a mask of patient's comfort and choice.   Interpreting Physician: Larey Seat , MD           Sleep Summary  Oxygen Saturation Statistics   Start Study Time: End Study Time: Total Recording Time:       10:55:30 PM 6:19:30 AM 7 h, 24 min  Total Sleep Time % REM of Sleep Time:  6 h, 19 min  32.6    Mean: 95 Minimum: 85 Maximum: 100  Mean of Desaturations Nadirs (%):   92  Oxygen Desaturation. %:   4-9 10-20 >20 Total  Events Number Total   104 8 92.9 7.1  0 0.0  112 100.0  Oxygen Saturation: <90 <=88 <85 <80 <70  Duration (minutes): Sleep % 2.1 0.6  1.0 0.0  0.3 0.0 0.0 0.0 0.0 0.0     Respiratory Indices      Total Events REM NREM All  Night  pRDI:  196  pAHI:  176 ODI:  112  pAHIc:  0  % CSR: 0.0 52.5 49.0 34.0 0.0 21.5 18.5 10.4 0.0 31.5 28.3 18.0 0.0       Pulse Rate Statistics during Sleep (BPM)      Mean: 64 Minimum: 36 Maximum: 110    Indices are calculated using technically valid sleep time of  6 h, 13 min. pRDI/pAHI are calculated using 02 desaturations ? 3%  Body Position Statistics  Position Supine Prone Right Left Non-Supine  Sleep (min) 239.5 46.5 37.5 10.0 94.0  Sleep % 63.2 12.3 9.9 2.6 24.8  pRDI 31.1 32.9 27.5 N/A 31.7  pAHI 27.8 28.9 25.9 N/A 29.1  ODI 17.7 19.7 22.7 N/A 21.4     Snoring Statistics Snoring Level (dB) >40 >50 >60 >70 >80 >Threshold (45)  Sleep (min) 378.6 38.2 8.9 0.0 0.0 101.8  Sleep % 99.9 10.1 2.3 0.0 0.0 26.9    Mean: 45 dB Sleep Stages Chart  pAHI=28.3

## 2019-06-06 NOTE — Telephone Encounter (Signed)
-----   Message from Larey Seat, MD sent at 06/06/2019  9:08 AM EST ----- Summary & Diagnosis:   Moderate - severe obstructive sleep apnea at AHI 28.3/h and  during REM sleep exacerbated to 49/h. The RDI indicates snoring  to be present, reaching over 60 decibels volume.     Recommendations:    Obstructive and REM sleep depended apnea will require treatment  by positive airway pressure. I will order an autotitration CPAP  device between 6 and 16 cm water pressure with heated humidity  and a mask of patient's comfort and choice.   Interpreting Physician: Larey Seat , MD

## 2019-06-08 ENCOUNTER — Encounter: Payer: Self-pay | Admitting: Neurology

## 2019-06-08 NOTE — Telephone Encounter (Signed)
Pt returned call. I advised pt that Dr. Brett Fairy reviewed their sleep study results and found that pt has sleep apnea moderate to severe. Dr. Brett Fairy recommends that pt starts auto CPAP. I reviewed PAP compliance expectations with the pt. Pt is agreeable to starting a CPAP. I advised pt that an order will be sent to a DME, [facility], and [facility] will call the pt within about one week after they file with the pt's insurance. [facility] will show the pt how to use the machine, fit for masks, and troubleshoot the CPAP if needed. A follow up appt was made for insurance purposes with Debbora Presto, Feb 24,2021 at 9:30 am. Pt verbalized understanding to arrive 15 minutes early and bring their CPAP. A letter with all of this information in it will be mailed to the pt as a reminder. I verified with the pt that the address we have on file is correct. Pt verbalized understanding of results. Pt had no questions at this time but was encouraged to call back if questions arise. I have sent the order to [facility] and have received confirmation that they have received the order.

## 2019-08-24 ENCOUNTER — Ambulatory Visit: Payer: Self-pay | Admitting: Family Medicine

## 2019-09-03 ENCOUNTER — Ambulatory Visit: Payer: BC Managed Care – PPO | Attending: Internal Medicine

## 2019-09-03 DIAGNOSIS — Z23 Encounter for immunization: Secondary | ICD-10-CM | POA: Insufficient documentation

## 2019-09-03 NOTE — Progress Notes (Signed)
   Covid-19 Vaccination Clinic  Name:  Tyrone Freeman    MRN: [id number] DOB: Jan 06, 1971  09/03/2019  [name] was observed post Covid-19 immunization for 15 minutes without incident. He was provided with Vaccine Information Sheet and instruction to access the V-Safe system.   [name] was instructed to call 911 with any severe reactions post vaccine: Marland Kitchen Difficulty breathing  . Swelling of face and throat  . A fast heartbeat  . A bad rash all over body  . Dizziness and weakness   Immunizations Administered    Name Date Dose VIS Date Route   Pfizer COVID-19 Vaccine 09/03/2019 11:07 AM 0.3 mL 06/10/2019 Intramuscular   Manufacturer: [employer], [employer]   Lot: [id number]   NDC: [address]-9

## 2019-09-11 ENCOUNTER — Encounter: Payer: Self-pay | Admitting: Internal Medicine

## 2019-09-12 ENCOUNTER — Other Ambulatory Visit: Payer: Self-pay | Admitting: Internal Medicine

## 2019-09-12 MED ORDER — SILDENAFIL CITRATE 20 MG PO TABS: 60.0000 mg | ORAL_TABLET | Freq: Every day | ORAL | 5 refills | Status: DC | PRN

## 2019-09-12 NOTE — Telephone Encounter (Signed)
Please advise- sildenafil no longer on med list.  

## 2019-09-24 ENCOUNTER — Ambulatory Visit: Payer: BC Managed Care – PPO | Attending: Internal Medicine

## 2019-09-24 DIAGNOSIS — Z23 Encounter for immunization: Secondary | ICD-10-CM

## 2019-09-24 NOTE — Progress Notes (Signed)
   Covid-19 Vaccination Clinic  Name:  Tyrone Freeman    MRN: [id number] DOB: Mar 14, 1971  09/24/2019  [name] was observed post Covid-19 immunization for 15 minutes without incident. He was provided with Vaccine Information Sheet and instruction to access the V-Safe system.   [name] was instructed to call 911 with any severe reactions post vaccine: Marland Kitchen Difficulty breathing  . Swelling of face and throat  . A fast heartbeat  . A bad rash all over body  . Dizziness and weakness   Immunizations Administered    Name Date Dose VIS Date Route   Pfizer COVID-19 Vaccine 09/24/2019 12:56 PM 0.3 mL 06/10/2019 Intramuscular   Manufacturer: [employer], [employer]   Lot: [id number]   NDC: [address]-5

## 2019-10-10 ENCOUNTER — Ambulatory Visit: Payer: Self-pay | Admitting: Family Medicine

## 2019-10-17 ENCOUNTER — Other Ambulatory Visit: Payer: Self-pay | Admitting: Internal Medicine

## 2020-06-08 ENCOUNTER — Other Ambulatory Visit: Payer: Self-pay | Admitting: Internal Medicine

## 2020-06-11 ENCOUNTER — Telehealth: Payer: Self-pay

## 2020-06-11 NOTE — Telephone Encounter (Signed)
PA denied. Pt's plan does not cover medication for erectile dysfunction.

## 2020-06-11 NOTE — Telephone Encounter (Signed)
PA initiated via Covermymeds; KEY: [id number]. Awaiting determination.

## 2020-08-01 ENCOUNTER — Ambulatory Visit (HOSPITAL_BASED_OUTPATIENT_CLINIC_OR_DEPARTMENT_OTHER)
Admission: RE | Admit: 2020-08-01 | Discharge: 2020-08-01 | Disposition: A | Discharge status: Home / Self Care | Payer: BC Managed Care – PPO | Source: Ambulatory Visit | Attending: Internal Medicine | Admitting: Internal Medicine

## 2020-08-01 ENCOUNTER — Ambulatory Visit: Payer: BC Managed Care – PPO | Admitting: Internal Medicine

## 2020-08-01 ENCOUNTER — Other Ambulatory Visit: Payer: Self-pay

## 2020-08-01 ENCOUNTER — Encounter: Payer: Self-pay | Admitting: Internal Medicine

## 2020-08-01 VITALS — BP 124/86 | HR 74 | Temp 98.7°F | Resp 16 | Ht 72.0 in | Wt 282.0 lb

## 2020-08-01 DIAGNOSIS — R0609 Other forms of dyspnea: Secondary | ICD-10-CM

## 2020-08-01 DIAGNOSIS — Z23 Encounter for immunization: Secondary | ICD-10-CM

## 2020-08-01 DIAGNOSIS — R0600 Dyspnea, unspecified: Secondary | ICD-10-CM | POA: Insufficient documentation

## 2020-08-01 LAB — COMPREHENSIVE METABOLIC PANEL
ALT: 29 U/L (ref 0–53)
AST: 24 U/L (ref 0–37)
Albumin: 4.2 g/dL (ref 3.5–5.2)
Alkaline Phosphatase: 63 U/L (ref 39–117)
BUN: 14 mg/dL (ref 6–23)
CO2: 30 mEq/L (ref 19–32)
Calcium: 9.6 mg/dL (ref 8.4–10.5)
Chloride: 103 mEq/L (ref 96–112)
Creatinine, Ser: 1.24 mg/dL (ref 0.40–1.50)
GFR: 68.29 mL/min (ref >60.00)
Glucose, Bld: 88 mg/dL (ref 70–99)
Potassium: 4.5 mEq/L (ref 3.5–5.1)
Sodium: 138 mEq/L (ref 135–145)
Total Bilirubin: 0.9 mg/dL (ref 0.2–1.2)
Total Protein: 7.2 g/dL (ref 6.0–8.3)

## 2020-08-01 LAB — CBC WITH DIFFERENTIAL/PLATELET
Basophils Absolute: 0 10*3/uL (ref 0.0–0.1)
Basophils Relative: 0.4 % (ref 0.0–3.0)
Eosinophils Absolute: 0.3 10*3/uL (ref 0.0–0.7)
Eosinophils Relative: 5.6 % — ABNORMAL HIGH (ref 0.0–5.0)
HCT: 45.9 % (ref 39.0–52.0)
Hemoglobin: 15.4 g/dL (ref 13.0–17.0)
Lymphocytes Relative: 26.5 % (ref 12.0–46.0)
Lymphs Abs: 1.4 10*3/uL (ref 0.7–4.0)
MCHC: 33.6 g/dL (ref 30.0–36.0)
MCV: 87.2 fl (ref 78.0–100.0)
Monocytes Absolute: 0.3 10*3/uL (ref 0.1–1.0)
Monocytes Relative: 5.3 % (ref 3.0–12.0)
Neutro Abs: 3.2 10*3/uL (ref 1.4–7.7)
Neutrophils Relative %: 62.2 % (ref 43.0–77.0)
Platelets: 209 10*3/uL (ref 150.0–400.0)
RBC: 5.26 Mil/uL (ref 4.22–5.81)
RDW: 14 % (ref 11.5–15.5)
WBC: 5.2 10*3/uL (ref 4.0–10.5)

## 2020-08-01 NOTE — Patient Instructions (Addendum)
    GO TO THE LAB : Get the blood work     GO TO THE FRONT DESK, PLEASE SCHEDULE YOUR APPOINTMENTS Come back for  A physical in 2-3 months       STOP BY THE FIRST FLOOR:  get the XR

## 2020-08-01 NOTE — Progress Notes (Signed)
Subjective:    Patient ID: Tyrone Freeman, male    DOB: 04-15-1971, 50 y.o.   MRN: [id number]  DOS:  08/01/2020 Type of visit - description: Acute, last office visit 03-2019  CC today is  DOE: In the last 6 months has noted shortness of breath when he goes upstairs to the 2nd floor, it does not happen every time he go upstairs but sx has gotten his attention. Interestingly, few months before the onset of the symptoms he had a cough that lasted several months.  He exercises 1 time a week, walks in the treadmill for 20 minutes. I noted weight gain.  Wt Readings from Last 3 Encounters:  08/01/20 282 lb (127.9 kg)  04/28/19 272 lb (123.4 kg)  04/15/19 269 lb 6 oz (122.2 kg)     Review of Systems Denies fever chills No chest pain, lower extremity edema (other than a recent injury of the left leg 2 weeks ago) or palpitations Currently with no cough, wheezing or sputum  Past Medical History:  Diagnosis Date  . Esophageal reflux   . Gastric ulcer   . Hypogonadism male   . Seasonal allergies 05/23/2012    Past Surgical History:  Procedure Laterality Date  . NO PAST SURGERIES      Allergies as of 08/01/2020      Reactions   Hydrocodone    Nausea, no rash or itching      Medication List       Accurate as of August 01, 2020 11:59 PM. If you have any questions, ask your nurse or doctor.        ibuprofen 800 MG tablet Commonly known as: ADVIL Take 800 mg by mouth 3 (three) times daily.   pantoprazole 40 MG tablet Commonly known as: PROTONIX Take 1 tablet (40 mg total) by mouth daily.   sildenafil 20 MG tablet Commonly known as: REVATIO Take 3-4 tablets (60-80 mg total) by mouth daily as needed.          Objective:   Physical Exam BP 124/86 (BP Location: Left Arm, Patient Position: Sitting, Cuff Size: Normal)   Pulse 74   Temp 98.7 F (37.1 C) (Oral)   Resp 16   Ht 6' (1.829 m)   Wt 282 lb (127.9 kg)   SpO2 97%   BMI 38.25 kg/m   General:   Well  developed, NAD, BMI noted.  HEENT:  Normocephalic . Face symmetric, atraumatic.  Not pale Neck: No JVD at 45 degrees Lungs:  CTA B Normal respiratory effort, no intercostal retractions, no accessory muscle use. Heart: RRR,  no murmur.  Abdomen:  Not distended, soft, non-tender. No rebound or rigidity.   Skin: Not pale. Not jaundice Lower extremities: no pretibial edema on the right, has a boot at the left leg. Neurologic:  alert & oriented X3.  Speech normal, gait appropriate for age and unassisted Psych--  Cognition and judgment appear intact.  Cooperative with normal attention span and concentration.  Behavior appropriate. No anxious or depressed appearing.     Assessment     ASSESSMENT GERD, h/o gastric ulcer H/o hypogonadism Mild, central hypogonadism, saw endo 2014, was rx p Clomid , took x 1 week, no s/e. Brain MRI was  not warranted  Seasonal allergies ED OSA, moderate to severe DX 05-2019   PLAN DOE: Started 6 months ago, no associated chest pain or cough. CV RF: No DM, no HTN, no FH CAD ,+ OSA. He is obese and somewhat sedentary.  H/o asthma as a child EKG today: No acute, unchanged from previous. Overall suspect deconditioning rather than CAD or asthma. Plan: Encourage gradual increase in exercise, 3 hours/week , call if unable to increase his stamina. We also talk about a stress test and a trial with preexercise inhaler but at this point we agreed to start the exercise program Labs: CMP, CBC, chest x-ray RTC CPX 2-3  Months  This visit occurred during the SARS-CoV-2 public health emergency.  Safety protocols were in place, including screening questions prior to the visit, additional usage of staff PPE, and extensive cleaning of exam room while observing appropriate contact time as indicated for disinfecting solutions.

## 2020-08-01 NOTE — Progress Notes (Signed)
Pre visit review using our clinic review tool, if applicable. No additional management support is needed unless otherwise documented below in the visit note. 

## 2020-08-02 NOTE — Assessment & Plan Note (Signed)
DOE: Started 6 months ago, no associated chest pain or cough. CV RF: No DM, no HTN, no FH CAD ,+ OSA. He is obese and somewhat sedentary.  H/o asthma as a child EKG today: No acute, unchanged from previous. Overall suspect deconditioning rather than CAD or asthma. Plan: Encourage gradual increase in exercise, 3 hours/week , call if unable to increase his stamina. We also talk about a stress test and a trial with preexercise inhaler but at this point we agreed to start the exercise program Labs: CMP, CBC, chest x-ray RTC CPX 2-3  Months

## 2020-08-08 ENCOUNTER — Encounter: Payer: Self-pay | Admitting: Internal Medicine

## 2020-09-06 ENCOUNTER — Encounter: Payer: BC Managed Care – PPO | Admitting: Internal Medicine

## 2020-09-07 DIAGNOSIS — S93432S Sprain of tibiofibular ligament of left ankle, sequela: Secondary | ICD-10-CM | POA: Insufficient documentation

## 2020-09-08 HISTORY — PX: ORIF ANKLE FRACTURE: SUR919

## 2020-09-10 ENCOUNTER — Encounter: Payer: Self-pay | Admitting: Internal Medicine

## 2020-10-11 ENCOUNTER — Encounter: Payer: Self-pay | Admitting: Internal Medicine

## 2020-10-11 MED ORDER — SILDENAFIL CITRATE 20 MG PO TABS: 60.0000 mg | ORAL_TABLET | Freq: Every day | ORAL | 1 refills | Status: DC | PRN

## 2020-10-29 ENCOUNTER — Ambulatory Visit: Payer: BC Managed Care – PPO | Admitting: Internal Medicine

## 2021-03-17 ENCOUNTER — Encounter: Payer: Self-pay | Admitting: Internal Medicine

## 2021-03-18 MED ORDER — SILDENAFIL CITRATE 20 MG PO TABS: 60.0000 mg | ORAL_TABLET | Freq: Every day | ORAL | 1 refills | Status: DC | PRN

## 2021-03-19 NOTE — Telephone Encounter (Signed)
Please advise regarding refilling pantoprazole.

## 2021-03-20 ENCOUNTER — Other Ambulatory Visit: Payer: Self-pay

## 2021-03-20 MED ORDER — PANTOPRAZOLE SODIUM 40 MG PO TBEC: 40.0000 mg | DELAYED_RELEASE_TABLET | Freq: Every day | ORAL | 3 refills | Status: DC

## 2021-06-07 ENCOUNTER — Other Ambulatory Visit: Payer: Self-pay | Admitting: Internal Medicine

## 2021-06-09 ENCOUNTER — Telehealth: Payer: BC Managed Care – PPO | Admitting: Physician Assistant

## 2021-06-09 DIAGNOSIS — J069 Acute upper respiratory infection, unspecified: Secondary | ICD-10-CM

## 2021-06-10 ENCOUNTER — Ambulatory Visit: Payer: BC Managed Care – PPO | Admitting: Family

## 2021-06-10 MED ORDER — BENZONATATE 100 MG PO CAPS: 100.0000 mg | ORAL_CAPSULE | Freq: Three times a day (TID) | ORAL | 0 refills | Status: AC

## 2021-06-10 NOTE — Progress Notes (Signed)

## 2021-06-20 ENCOUNTER — Encounter: Payer: Self-pay | Admitting: Internal Medicine

## 2021-07-03 ENCOUNTER — Telehealth: Payer: Self-pay | Admitting: Internal Medicine

## 2021-07-03 NOTE — Telephone Encounter (Signed)
Pt called and stated he is still having issues with a bad cough. He feels fine, its just the cough he cant get rid of. We don't have any appointments until Friday. Pt wants to know what to do or what to take.

## 2021-07-03 NOTE — Telephone Encounter (Signed)
Please advise- Pt had an e-visit on 06/09/21 for URI.

## 2021-07-03 NOTE — Telephone Encounter (Signed)
LMOM informing Pt to call office to schedule visit- (discussed w/ PCP)- in meantime can try OTC Robitussin DM or Mucinex DM.

## 2021-07-03 NOTE — Telephone Encounter (Signed)
Schedule an in-person visit

## 2021-07-17 ENCOUNTER — Ambulatory Visit (INDEPENDENT_AMBULATORY_CARE_PROVIDER_SITE_OTHER): Payer: BC Managed Care – PPO | Admitting: Internal Medicine

## 2021-07-17 ENCOUNTER — Encounter: Payer: Self-pay | Admitting: Internal Medicine

## 2021-07-17 VITALS — BP 126/68 | HR 88 | Temp 98.4°F | Resp 16 | Ht 72.0 in | Wt 271.5 lb

## 2021-07-17 DIAGNOSIS — Z0000 Encounter for general adult medical examination without abnormal findings: Secondary | ICD-10-CM | POA: Diagnosis not present

## 2021-07-17 DIAGNOSIS — Z23 Encounter for immunization: Secondary | ICD-10-CM

## 2021-07-17 DIAGNOSIS — Z1159 Encounter for screening for other viral diseases: Secondary | ICD-10-CM | POA: Diagnosis not present

## 2021-07-17 DIAGNOSIS — Z1211 Encounter for screening for malignant neoplasm of colon: Secondary | ICD-10-CM

## 2021-07-17 MED ORDER — SILDENAFIL CITRATE 20 MG PO TABS: 60.0000 mg | ORAL_TABLET | Freq: Every day | ORAL | 5 refills | Status: DC | PRN

## 2021-07-17 NOTE — Patient Instructions (Signed)
Seed with a COVID booster next month  GO TO THE LAB : Get the blood work     GO TO THE FRONT DESK, PLEASE SCHEDULE YOUR APPOINTMENTS Come back for a physical exam in 1 year

## 2021-07-17 NOTE — Progress Notes (Signed)
Subjective:    Patient ID: Tyrone Freeman, male    DOB: November 29, 1970, 51 y.o.   MRN: [id number]  DOS:  07/17/2021 Type of visit - description: cpx  Here for CPX. Recently had COVID, he feels fully recuperated. The last time I saw him, he complained of DOE, symptoms resolved.  Wt Readings from Last 3 Encounters:  07/17/21 271 lb 8 oz (123.2 kg)  08/01/20 282 lb (127.9 kg)  04/28/19 272 lb (123.4 kg)    Review of Systems  Other than above, a 14 point review of systems is negative       Past Medical History:  Diagnosis Date   Esophageal reflux    Gastric ulcer    Hypogonadism male    Seasonal allergies 05/23/2012    Past Surgical History:  Procedure Laterality Date   ORIF ANKLE FRACTURE Left 09/08/2020   Social History   Socioeconomic History   Marital status: Married    Spouse name: Not on file   Number of children: 1   Years of education: Not on file   Highest education level: Not on file  Occupational History   Occupation: Contractor, [place]  Tobacco Use   Smoking status: Never   Smokeless tobacco: Never  Vaping Use   Vaping Use: Never used  Substance and Sexual Activity   Alcohol use: Yes    Comment: rarely    Drug use: No   Sexual activity: Not on file  Other Topics Concern   Not on file  Social History Narrative   Household-- pt, wife , daughter  2002      Right handed   Caffeine: "I only drink coffee maybe once every 2 or 3 weeks"   Social Determinants of Health   Financial Resource Strain: Not on file  Food Insecurity: Not on file  Transportation Needs: Not on file  Physical Activity: Not on file  Stress: Not on file  Social Connections: Not on file  Intimate Partner Violence: Not on file    Current Outpatient Medications  Medication Instructions   pantoprazole (PROTONIX) 40 mg, Oral, Daily   sildenafil (REVATIO) 60-80 mg, Oral, Daily PRN       Objective:   Physical Exam BP 126/68 (BP Location: Left Arm, Patient  Position: Sitting, Cuff Size: Normal)    Pulse 88    Temp 98.4 F (36.9 C) (Oral)    Resp 16    Ht 6' (1.829 m)    Wt 271 lb 8 oz (123.2 kg)    SpO2 97%    BMI 36.82 kg/m  General: Well developed, NAD, BMI noted Neck: No  thyromegaly  HEENT:  Normocephalic . Face symmetric, atraumatic Lungs:  CTA B Normal respiratory effort, no intercostal retractions, no accessory muscle use. Heart: RRR,  no murmur.  Abdomen:  Not distended, soft, non-tender. No rebound or rigidity.   Lower extremities: no pretibial edema bilaterally  Skin: Exposed areas without rash. Not pale. Not jaundice DRE: Normal sphincter tone, no stool.  Unable to reach the prostate gland. Neurologic:  alert & oriented X3.  Speech normal, gait appropriate for age and unassisted Strength symmetric and appropriate for age.  Psych: Cognition and judgment appear intact.  Cooperative with normal attention span and concentration.  Behavior appropriate. No anxious or depressed appearing.     Assessment     ASSESSMENT GERD, h/o gastric ulcer H/o hypogonadism Mild, central hypogonadism, saw endo 2014, was rx p Clomid , took x 1 week, no s/e. Brain  MRI was  not warranted  Seasonal allergies ED OSA, moderate to severe DX 05-2019, intolerant to CPAP   PLAN Here for CPX DOE: See last visit, c/o DOE, symptoms largely resolved after he started a exercise program, lost weight and changed his diet.   GERD: Rarely has symptoms, on PPIs as needed ED: RF sildenafil OSA: CPAP intolerant, he is exercising, losing some weight, snoring has decreased, energy level during the daytime is good. RTC 1 year    This visit occurred during the SARS-CoV-2 public health emergency.  Safety protocols were in place, including screening questions prior to the visit, additional usage of staff PPE, and extensive cleaning of exam room while observing appropriate contact time as indicated for disinfecting solutions.

## 2021-07-17 NOTE — Assessment & Plan Note (Signed)
-   Td 11/2017 -Shingrix discussed - COVID vaccine: Recommend booster next month - flu shot: Today --CCS: Negative I fob 03-2019, 3 options discussed, elected GI.  Referral sent. --Prostate cancer screening:   DRE unable to reach the prostate, checking a PSA.  No symptoms. --Diet-exercise: Doing great, + weight loss --Labs:   CMP, FLP, CBC, TSH, PSA, hep C

## 2021-07-17 NOTE — Assessment & Plan Note (Signed)
Here for CPX DOE: See last visit, c/o DOE, symptoms largely resolved after he started a exercise program, lost weight and changed his diet.   GERD: Rarely has symptoms, on PPIs as needed ED: RF sildenafil OSA: CPAP intolerant, he is exercising, losing some weight, snoring has decreased, energy level during the daytime is good. RTC 1 year

## 2021-07-18 LAB — CBC WITH DIFFERENTIAL/PLATELET
Basophils Absolute: 0 10*3/uL (ref 0.0–0.1)
Basophils Relative: 0.8 % (ref 0.0–3.0)
Eosinophils Absolute: 0.1 10*3/uL (ref 0.0–0.7)
Eosinophils Relative: 2.6 % (ref 0.0–5.0)
HCT: 45.6 % (ref 39.0–52.0)
Hemoglobin: 15.4 g/dL (ref 13.0–17.0)
Lymphocytes Relative: 18.7 % (ref 12.0–46.0)
Lymphs Abs: 0.9 10*3/uL (ref 0.7–4.0)
MCHC: 33.7 g/dL (ref 30.0–36.0)
MCV: 86.5 fl (ref 78.0–100.0)
Monocytes Absolute: 0.3 10*3/uL (ref 0.1–1.0)
Monocytes Relative: 7 % (ref 3.0–12.0)
Neutro Abs: 3.2 10*3/uL (ref 1.4–7.7)
Neutrophils Relative %: 70.9 % (ref 43.0–77.0)
Platelets: 234 10*3/uL (ref 150.0–400.0)
RBC: 5.27 Mil/uL (ref 4.22–5.81)
RDW: 14 % (ref 11.5–15.5)
WBC: 4.6 10*3/uL (ref 4.0–10.5)

## 2021-07-18 LAB — LIPID PANEL
Cholesterol: 204 mg/dL — ABNORMAL HIGH (ref 0–200)
HDL: 49 mg/dL (ref >39.00)
LDL Cholesterol: 132 mg/dL — ABNORMAL HIGH (ref 0–99)
NonHDL: 155.44
Total CHOL/HDL Ratio: 4
Triglycerides: 118 mg/dL (ref 0.0–149.0)
VLDL: 23.6 mg/dL (ref 0.0–40.0)

## 2021-07-18 LAB — COMPREHENSIVE METABOLIC PANEL
ALT: 33 U/L (ref 0–53)
AST: 19 U/L (ref 0–37)
Albumin: 4.4 g/dL (ref 3.5–5.2)
Alkaline Phosphatase: 59 U/L (ref 39–117)
BUN: 12 mg/dL (ref 6–23)
CO2: 29 mEq/L (ref 19–32)
Calcium: 9.8 mg/dL (ref 8.4–10.5)
Chloride: 102 mEq/L (ref 96–112)
Creatinine, Ser: 1.43 mg/dL (ref 0.40–1.50)
GFR: 57.16 mL/min — ABNORMAL LOW (ref >60.00)
Glucose, Bld: 84 mg/dL (ref 70–99)
Potassium: 4.1 mEq/L (ref 3.5–5.1)
Sodium: 141 mEq/L (ref 135–145)
Total Bilirubin: 1 mg/dL (ref 0.2–1.2)
Total Protein: 7.6 g/dL (ref 6.0–8.3)

## 2021-07-18 LAB — HEPATITIS C ANTIBODY
Hepatitis C Ab: NONREACTIVE
SIGNAL TO CUT-OFF: 0.08 (ref <1.00)

## 2021-07-18 LAB — PSA: PSA: 0.96 ng/mL (ref 0.10–4.00)

## 2021-07-18 LAB — TSH: TSH: 1.19 u[IU]/mL (ref 0.35–5.50)

## 2021-08-18 ENCOUNTER — Other Ambulatory Visit: Payer: Self-pay | Admitting: Internal Medicine

## 2021-09-24 ENCOUNTER — Other Ambulatory Visit: Payer: Self-pay | Admitting: Internal Medicine

## 2021-10-30 ENCOUNTER — Ambulatory Visit (AMBULATORY_SURGERY_CENTER): Payer: BC Managed Care – PPO | Admitting: *Deleted

## 2021-10-30 VITALS — Ht 72.0 in | Wt 240.0 lb

## 2021-10-30 DIAGNOSIS — Z1211 Encounter for screening for malignant neoplasm of colon: Secondary | ICD-10-CM

## 2021-10-30 NOTE — Progress Notes (Signed)
No egg or soy allergy known to patient  No issues known to pt with past sedation with any surgeries or procedures Patient denies ever being told they had issues or difficulty with intubation  No FH of Malignant Hyperthermia Pt is not on diet pills Pt is not on  home 02  Pt is not on blood thinners  Pt denies issues with constipation  No A fib or A flutter   PV completed over the phone. Pt verified name, DOB, address and insurance during PV today.    Pt encouraged to call with questions or issues.  If pt has My chart, procedure instructions sent via My Chart  Insurance confirmed with pt at PV today   

## 2021-11-18 ENCOUNTER — Encounter: Payer: Self-pay | Admitting: Internal Medicine

## 2021-11-22 ENCOUNTER — Encounter: Payer: Self-pay | Admitting: Internal Medicine

## 2021-11-29 ENCOUNTER — Ambulatory Visit (AMBULATORY_SURGERY_CENTER): Payer: BC Managed Care – PPO | Admitting: Internal Medicine

## 2021-11-29 ENCOUNTER — Encounter: Payer: Self-pay | Admitting: Internal Medicine

## 2021-11-29 VITALS — BP 124/91 | HR 87 | Temp 97.3°F | Resp 18 | Ht 72.0 in | Wt 240.0 lb

## 2021-11-29 DIAGNOSIS — Z1211 Encounter for screening for malignant neoplasm of colon: Secondary | ICD-10-CM | POA: Diagnosis present

## 2021-11-29 MED ORDER — SODIUM CHLORIDE 0.9 % IV SOLN: 500.0000 mL | INTRAVENOUS | Status: DC

## 2021-11-29 NOTE — Op Note (Signed)
[facility] Patient Name: Tyrone Freeman Procedure Date: 11/29/2021 7:34 AM MRN: [id number] Endoscopist: Iva Boop , MD Age: 51 Referring MD:  Date of Birth: Feb 06, 1971 Gender: Male Account #: [id number] Procedure:                Colonoscopy Indications:              Screening for colorectal malignant neoplasm, This                            is the patient's first colonoscopy Medicines:                Monitored Anesthesia Care Procedure:                Pre-Anesthesia Assessment:                           - Prior to the procedure, a History and Physical                            was performed, and patient medications and                            allergies were reviewed. The patient's tolerance of                            previous anesthesia was also reviewed. The risks                            and benefits of the procedure and the sedation                            options and risks were discussed with the patient.                            All questions were answered, and informed consent                            was obtained. Prior Anticoagulants: The patient has                            taken no previous anticoagulant or antiplatelet                            agents. ASA Grade Assessment: II - A patient with                            mild systemic disease. After reviewing the risks                            and benefits, the patient was deemed in                            satisfactory condition to undergo the procedure.  After obtaining informed consent, the colonoscope                            was passed under direct vision. Throughout the                            procedure, the patient's blood pressure, pulse, and                            oxygen saturations were monitored continuously. The                            CF [id number] #[id number] was introduced through the anus                            and advanced to the the  cecum, identified by                            appendiceal orifice and ileocecal valve. The                            colonoscopy was performed without difficulty. The                            patient tolerated the procedure well. The quality                            of the bowel preparation was good. The ileocecal                            valve, appendiceal orifice, and rectum were                            photographed. The bowel preparation used was                            Miralax via split dose instruction. Scope In: 8:06:25 AM Scope Out: 8:18:42 AM Scope Withdrawal Time: 0 hours 10 minutes 16 seconds  Total Procedure Duration: 0 hours 12 minutes 17 seconds  Findings:                 The perianal and digital rectal examinations were                            normal. Pertinent negatives include normal prostate                            (size, shape, and consistency).                           The entire examined colon appeared normal on direct                            and retroflexion views. Complications:  No immediate complications. Estimated Blood Loss:     Estimated blood loss: none. Impression:               - The entire examined colon is normal on direct and                            retroflexion views.                           - No specimens collected. Recommendation:           - Patient has a contact number available for                            emergencies. The signs and symptoms of potential                            delayed complications were discussed with the                            patient. Return to normal activities tomorrow.                            Written discharge instructions were provided to the                            patient.                           - Resume previous diet.                           - Continue present medications.                           - Repeat colonoscopy or other appropriate test in                             10 years for screening purposes. Iva Booparl E Deantae Shackleton, MD 11/29/2021 8:23:50 AM This report has been signed electronically.

## 2021-11-29 NOTE — Progress Notes (Signed)
VSS, transported to PACU °

## 2021-11-29 NOTE — Progress Notes (Signed)
[facility] History and Physical   Primary Care Physician:  Wanda Plump, MD   Reason for Procedure:   CRCA screening    Plan:    colonoscopy     HPI: Tyrone Freeman is a 51 y.o. male here for colon cancer screening   Past Medical History:  Diagnosis Date   Allergy    Esophageal reflux    Gastric ulcer    Hypogonadism male    Seasonal allergies 05/23/2012   Sleep apnea    does not wear cpap    Past Surgical History:  Procedure Laterality Date   ORIF ANKLE FRACTURE Left 09/08/2020   UPPER GASTROINTESTINAL ENDOSCOPY     WISDOM TOOTH EXTRACTION      Prior to Admission medications   Medication Sig Start Date End Date Taking? Authorizing Provider  Ascorbic Acid (VITAMIN C PO) Take by mouth.    [provider]  pantoprazole (PROTONIX) 40 MG tablet Take 1 tablet (40 mg total) by mouth daily. 03/20/21   Wanda Plump, MD  sildenafil (REVATIO) 20 MG tablet TAKE 3-4 TABLETS (60-80 MG TOTAL) BY MOUTH DAILY AS NEEDED. 09/24/21   Wanda Plump, MD  VITAMIN D PO Take by mouth.    [provider]  zinc gluconate 50 MG tablet Take 50 mg by mouth daily.    [provider]    Current Outpatient Medications  Medication Sig Dispense Refill   Ascorbic Acid (VITAMIN C PO) Take by mouth.     pantoprazole (PROTONIX) 40 MG tablet Take 1 tablet (40 mg total) by mouth daily. 90 tablet 3   sildenafil (REVATIO) 20 MG tablet TAKE 3-4 TABLETS (60-80 MG TOTAL) BY MOUTH DAILY AS NEEDED. 30 tablet 5   VITAMIN D PO Take by mouth.     zinc gluconate 50 MG tablet Take 50 mg by mouth daily.     Current Facility-Administered Medications  Medication Dose Route Frequency Provider Last Rate Last Admin   0.9 %  sodium chloride infusion  500 mL Intravenous Continuous Iva Boop, MD        Allergies as of 11/29/2021 - Review Complete 11/29/2021  Allergen Reaction Noted   Hydrocodone  06/19/2011    Family History  Problem Relation Age of Onset   Colon polyps  Mother    Prostate cancer Maternal Grandfather 58   CAD Neg Hx    Diabetes Neg Hx    Colon cancer Neg Hx    Sleep apnea Neg Hx        not that he is aware of    Esophageal cancer Neg Hx    Rectal cancer Neg Hx    Stomach cancer Neg Hx     Social History   Socioeconomic History   Marital status: Married    Spouse name: Not on file   Number of children: 1   Years of education: Not on file   Highest education level: Not on file  Occupational History   Occupation: Contractor, [place]  Tobacco Use   Smoking status: Never   Smokeless tobacco: Never  Vaping Use   Vaping Use: Never used  Substance and Sexual Activity   Alcohol use: Yes    Comment: rarely    Drug use: No   Sexual activity: Not on file  Other Topics Concern   Not on file  Social History Narrative   Household-- pt, wife , daughter  2002      Right handed   Caffeine: "I  only drink coffee maybe once every 2 or 3 weeks"   Social Determinants of Health   Financial Resource Strain: Not on file  Food Insecurity: Not on file  Transportation Needs: Not on file  Physical Activity: Not on file  Stress: Not on file  Social Connections: Not on file  Intimate Partner Violence: Not on file    Review of Systems:  All other review of systems negative except as mentioned in the HPI.  Physical Exam: Vital signs BP 138/83   Pulse 77   Temp (!) 97.3 F (36.3 C)   Ht 6' (1.829 m)   Wt 240 lb (108.9 kg)   SpO2 98%   BMI 32.55 kg/m   General:   Alert,  Well-developed, well-nourished, pleasant and cooperative in NAD Lungs:  Clear throughout to auscultation.   Heart:  Regular rate and rhythm; no murmurs, clicks, rubs,  or gallops. Abdomen:  Soft, nontender and nondistended. Normal bowel sounds.   Neuro/Psych:  Alert and cooperative. Normal mood and affect. A and O x 3   @Ryleeann Urquiza  , MD, [facility] [phone] (pager) 11/29/2021 8:00 AM@

## 2021-11-29 NOTE — Patient Instructions (Addendum)
I am pleased to tell you the colonoscopy was normal. No polyps or cancer were found.  Next routine colonoscopy or other screening test in 10 years - 2033.  I appreciate the opportunity to care for you. Iva Boop, MD, FACG    YOU HAD AN ENDOSCOPIC PROCEDURE TODAY AT THE [facility]:   Refer to the procedure report that was given to you for any specific questions about what was found during the examination.  If the procedure report does not answer your questions, please call your gastroenterologist to clarify.  If you requested that your care partner not be given the details of your procedure findings, then the procedure report has been included in a sealed envelope for you to review at your convenience later.  YOU SHOULD EXPECT: Some feelings of bloating in the abdomen. Passage of more gas than usual.  Walking can help get rid of the air that was put into your GI tract during the procedure and reduce the bloating. If you had a lower endoscopy (such as a colonoscopy or flexible sigmoidoscopy) you may notice spotting of blood in your stool or on the toilet paper. If you underwent a bowel prep for your procedure, you may not have a normal bowel movement for a few days.  Please Note:  You might notice some irritation and congestion in your nose or some drainage.  This is from the oxygen used during your procedure.  There is no need for concern and it should clear up in a day or so.  SYMPTOMS TO REPORT IMMEDIATELY:  Following lower endoscopy (colonoscopy or flexible sigmoidoscopy):  Excessive amounts of blood in the stool  Significant tenderness or worsening of abdominal pains  Swelling of the abdomen that is new, acute  Fever of 100F or higher   For urgent or emergent issues, a gastroenterologist can be reached at any hour by calling [phone]. Do not use MyChart messaging for urgent concerns.    DIET:  We do recommend a small meal at first, but then you may proceed  to your regular diet.  Drink plenty of fluids but you should avoid alcoholic beverages for 24 hours.  ACTIVITY:  You should plan to take it easy for the rest of today and you should NOT DRIVE or use heavy machinery until tomorrow (because of the sedation medicines used during the test).    FOLLOW UP: Our staff will call the number listed on your records 48-72 hours following your procedure to check on you and address any questions or concerns that you may have regarding the information given to you following your procedure. If we do not reach you, we will leave a message.  We will attempt to reach you two times.  During this call, we will ask if you have developed any symptoms of COVID 19. If you develop any symptoms (ie: fever, flu-like symptoms, shortness of breath, cough etc.) before then, please call [phone].  If you test positive for Covid 19 in the 2 weeks post procedure, please call and report this information to [place].    If any biopsies were taken you will be contacted by phone or by letter within the next 1-3 weeks.  Please call us at [phone] if you have not heard about the biopsies in 3 weeks.    SIGNATURES/CONFIDENTIALITY: You and/or your care partner have signed paperwork which will be entered into your electronic medical record.  These signatures attest to the fact that that the information above  on your After Visit Summary has been reviewed and is understood.  Full responsibility of the confidentiality of this discharge information lies with you and/or your care-partner.

## 2021-12-02 ENCOUNTER — Telehealth: Payer: Self-pay

## 2021-12-02 ENCOUNTER — Telehealth: Payer: Self-pay | Admitting: *Deleted

## 2021-12-02 NOTE — Telephone Encounter (Signed)
Left message on f/u call 

## 2021-12-02 NOTE — Telephone Encounter (Signed)
First post procedure follow up call, no answer 

## 2022-04-22 ENCOUNTER — Encounter: Payer: Self-pay | Admitting: Internal Medicine

## 2022-04-22 MED ORDER — PANTOPRAZOLE SODIUM 40 MG PO TBEC: 40.0000 mg | DELAYED_RELEASE_TABLET | Freq: Every day | ORAL | 1 refills | Status: DC

## 2022-05-09 IMAGING — DX DG CHEST 2V
2 series · 2 of 2 positions shown · non-contrast
Comparison: 09/10/2010

CLINICAL DATA: Shortness of breath for 6 months

EXAM:
CHEST - 2 VIEW

[chest pa]
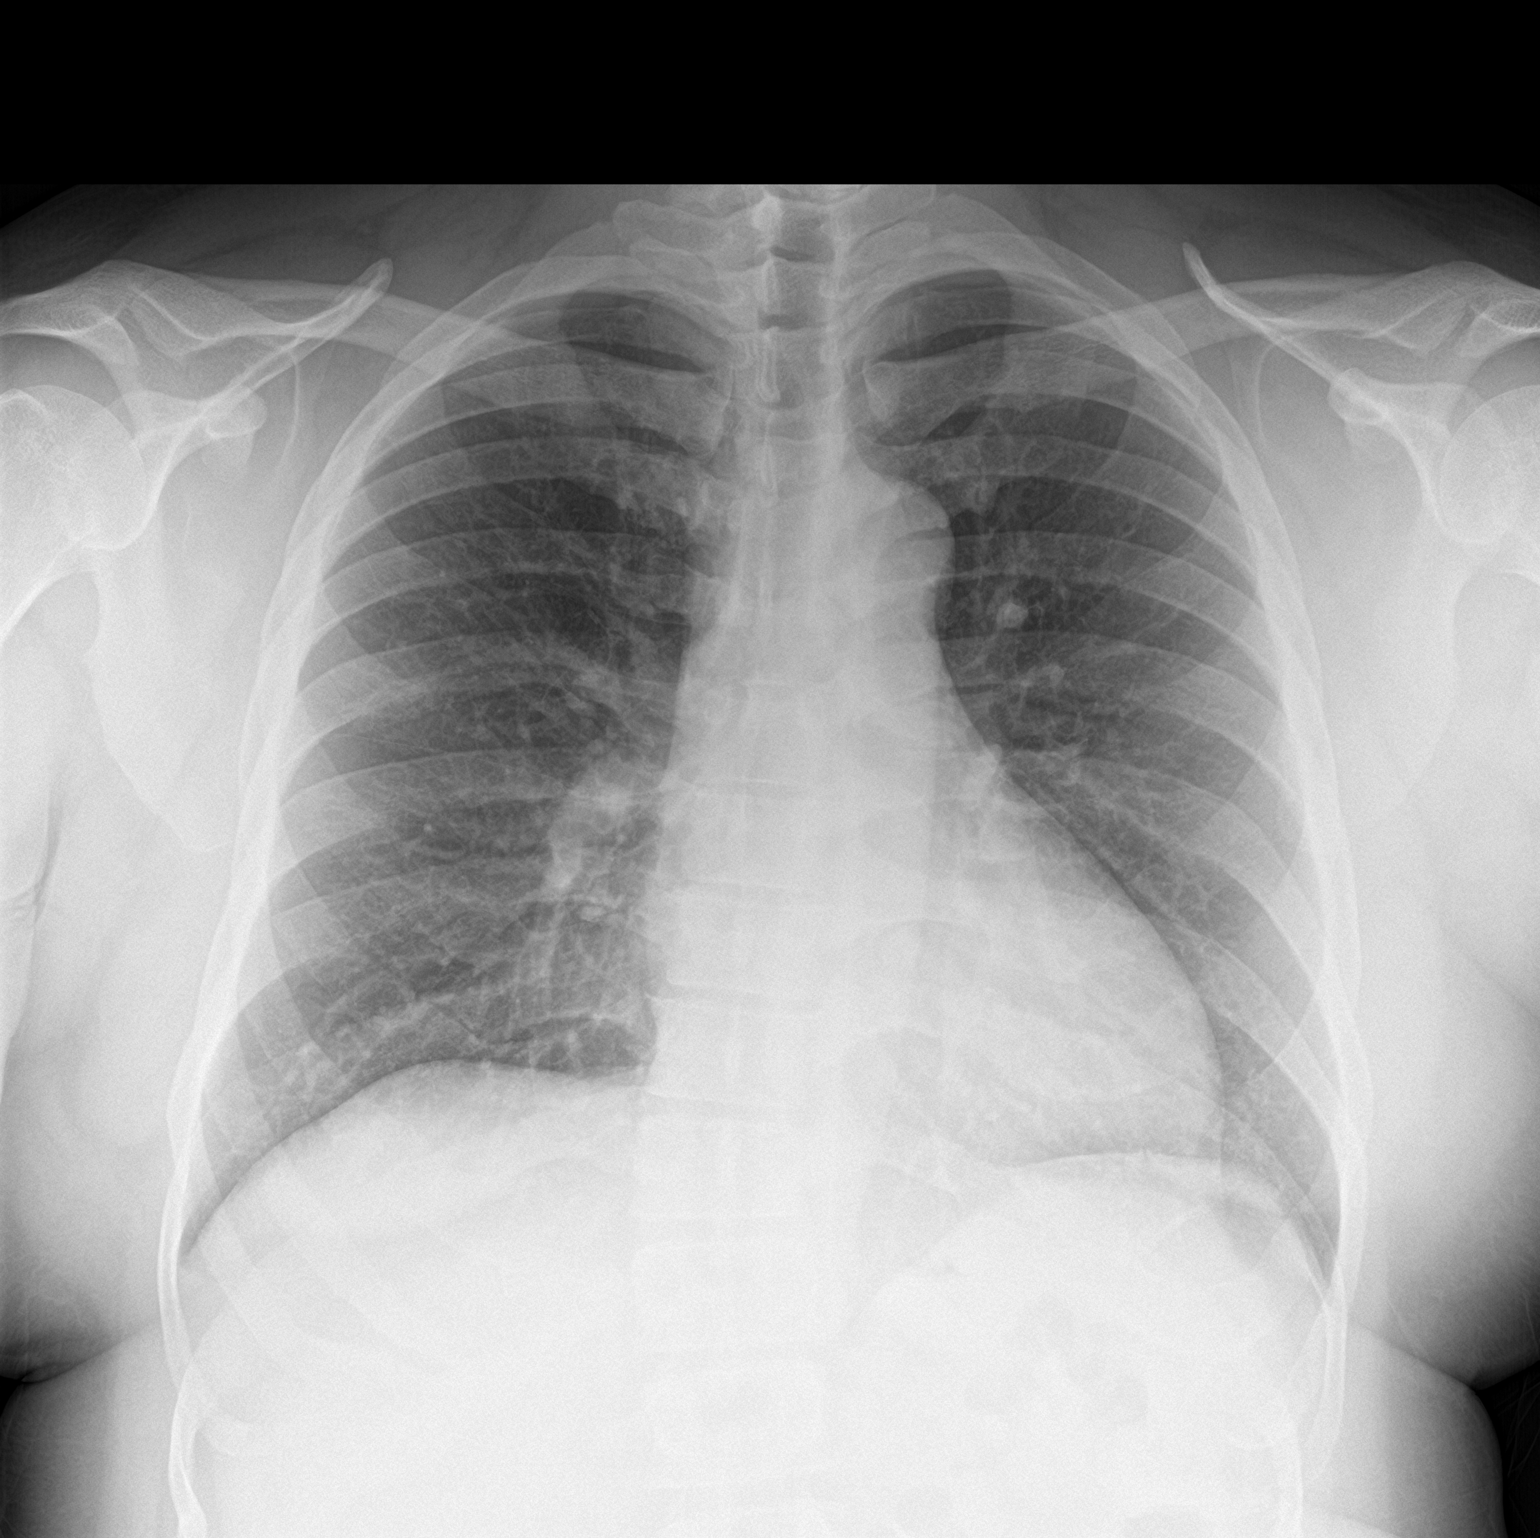

[chest lat]
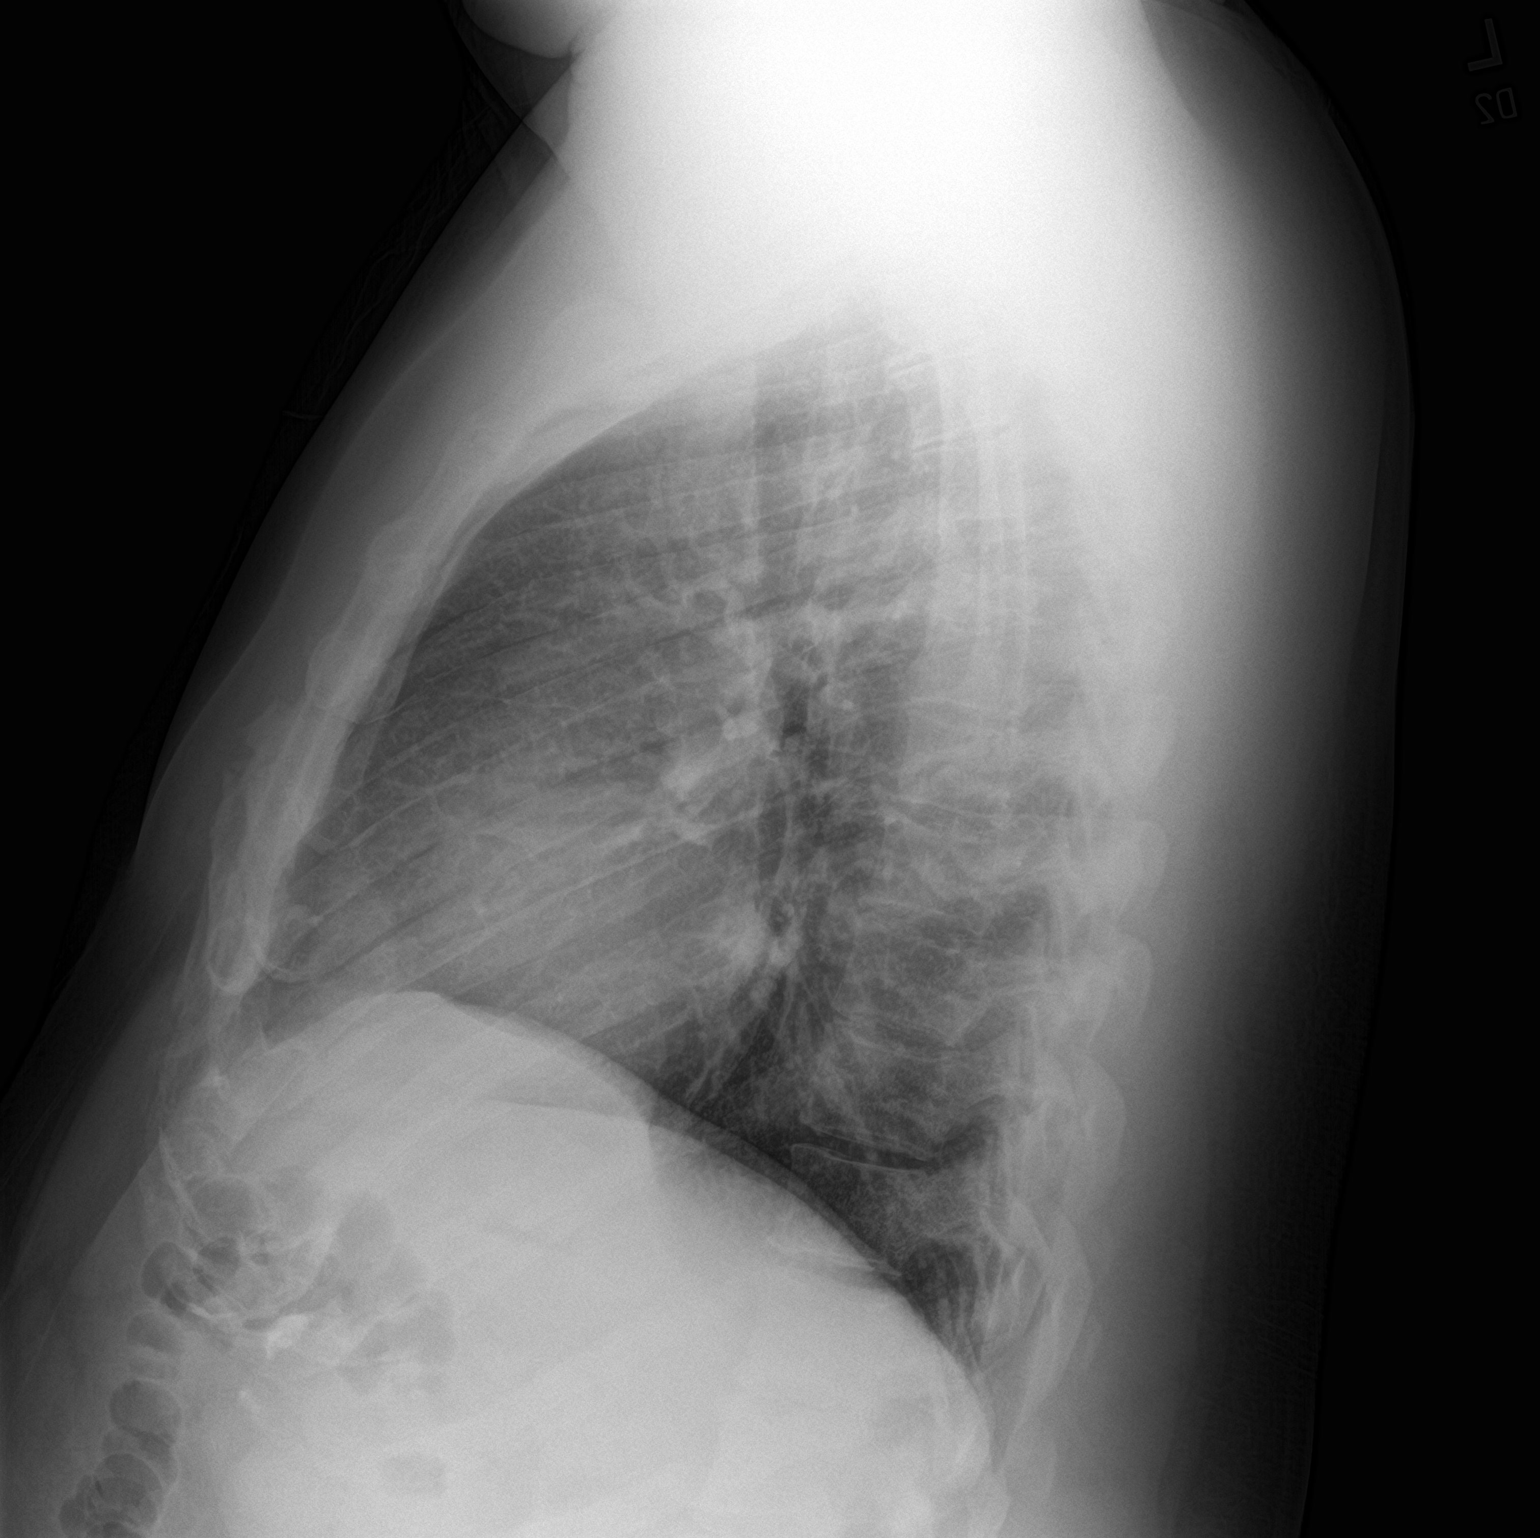

[2 of 2 positions shown; findings below may reference images not displayed]

FINDINGS: The heart size and mediastinal contours are within normal limits.
Both lungs are clear. The visualized skeletal structures are
unremarkable.
IMPRESSION: No acute abnormality of the lungs.

## 2022-10-26 ENCOUNTER — Telehealth: Payer: BC Managed Care – PPO | Admitting: Physician Assistant

## 2022-10-26 DIAGNOSIS — J208 Acute bronchitis due to other specified organisms: Secondary | ICD-10-CM

## 2022-10-27 MED ORDER — PREDNISONE 20 MG PO TABS: 40.0000 mg | ORAL_TABLET | Freq: Every day | ORAL | 0 refills | Status: DC

## 2022-10-27 MED ORDER — BENZONATATE 100 MG PO CAPS: 100.0000 mg | ORAL_CAPSULE | Freq: Three times a day (TID) | ORAL | 0 refills | Status: DC | PRN

## 2022-10-27 NOTE — Progress Notes (Signed)
I have spent 5 minutes in review of e-visit questionnaire, review and updating patient chart, medical decision making and response to patient.   Gomer France Cody Hayward Rylander, PA-C    

## 2022-10-27 NOTE — Progress Notes (Signed)
E-Visit for Cough  We are sorry that you are not feeling well.  Here is how we plan to help!  Based on your presentation I believe you most likely have A cough due to a virus.  This is called viral bronchitis and is best treated by rest, plenty of fluids and control of the cough.  You may use Ibuprofen or Tylenol as directed to help your symptoms.     In addition I have prescribed A prescription cough medication called Tessalon Perles 100mg . You may take 1-2 capsules every 8 hours as needed for your cough. I have also sent in a short course of prednisone to relax airways.   From your responses in the eVisit questionnaire you describe inflammation in the upper respiratory tract which is causing a significant cough.  This is commonly called Bronchitis and has four common causes:   Allergies Viral Infections Acid Reflux Bacterial Infection Allergies, viruses and acid reflux are treated by controlling symptoms or eliminating the cause. An example might be a cough caused by taking certain blood pressure medications. You stop the cough by changing the medication. Another example might be a cough caused by acid reflux. Controlling the reflux helps control the cough.  USE OF BRONCHODILATOR ("RESCUE") INHALERS: There is a risk from using your bronchodilator too frequently.  The risk is that over-reliance on a medication which only relaxes the muscles surrounding the breathing tubes can reduce the effectiveness of medications prescribed to reduce swelling and congestion of the tubes themselves.  Although you feel brief relief from the bronchodilator inhaler, your asthma may actually be worsening with the tubes becoming more swollen and filled with mucus.  This can delay other crucial treatments, such as oral steroid medications. If you need to use a bronchodilator inhaler daily, several times per day, you should discuss this with your provider.  There are probably better treatments that could be used to keep  your asthma under control.     HOME CARE Only take medications as instructed by your medical team. Complete the entire course of an antibiotic. Drink plenty of fluids and get plenty of rest. Avoid close contacts especially the very young and the elderly Cover your mouth if you cough or cough into your sleeve. Always remember to wash your hands A steam or ultrasonic humidifier can help congestion.   GET HELP RIGHT AWAY IF: You develop worsening fever. You become short of breath You cough up blood. Your symptoms persist after you have completed your treatment plan MAKE SURE YOU  Understand these instructions. Will watch your condition. Will get help right away if you are not doing well or get worse.    Thank you for choosing an e-visit.  Your e-visit answers were reviewed by a board certified advanced clinical practitioner to complete your personal care plan. Depending upon the condition, your plan could have included both over the counter or prescription medications.  Please review your pharmacy choice. Make sure the pharmacy is open so you can pick up prescription now. If there is a problem, you may contact your provider through [employer] and have the prescription routed to another pharmacy.  Your safety is important to [place]. If you have drug allergies check your prescription carefully.   For the next 24 hours you can use MyChart to ask questions about today's visit, request a non-urgent call back, or ask for a work or school excuse. You will get an email in the next two days asking about your experience. I  hope that your e-visit has been valuable and will speed your recovery.

## 2022-11-18 MED ORDER — BENZONATATE 100 MG PO CAPS: 100.0000 mg | ORAL_CAPSULE | Freq: Three times a day (TID) | ORAL | 0 refills | Status: DC | PRN

## 2022-11-18 NOTE — Addendum Note (Signed)
Addended by: Christeen Douglas Z on: 11/18/2022 05:01 PM   Modules accepted: Orders

## 2022-12-10 ENCOUNTER — Encounter: Payer: Self-pay | Admitting: Internal Medicine

## 2022-12-10 ENCOUNTER — Ambulatory Visit (INDEPENDENT_AMBULATORY_CARE_PROVIDER_SITE_OTHER): Payer: BC Managed Care – PPO | Admitting: Internal Medicine

## 2022-12-10 VITALS — BP 136/72 | HR 78 | Temp 98.2°F | Resp 18 | Ht 72.0 in | Wt 289.2 lb

## 2022-12-10 DIAGNOSIS — E291 Testicular hypofunction: Secondary | ICD-10-CM | POA: Diagnosis not present

## 2022-12-10 DIAGNOSIS — Z0000 Encounter for general adult medical examination without abnormal findings: Secondary | ICD-10-CM

## 2022-12-10 NOTE — Patient Instructions (Addendum)
Please consider go to the healthy weight and wellness clinic to help you with weight loss.  Vaccines I recommend: Covid booster Shingrix (shingles) Flu shot every fall    GO TO THE FRONT DESK, PLEASE SCHEDULE YOUR APPOINTMENTS Come back for   blood work, fasting at your earliest convenience, early in the morning  Come back for a checkup in 4 months

## 2022-12-10 NOTE — Progress Notes (Signed)
Subjective:    Patient ID: Tyrone Freeman, male    DOB: [birth year], 52 y.o.   MRN: [id number]  DOS:  12/10/2022 Type of visit - description: CPX  Here for CPX. Reports no major concerns. Occasionally has heartburn, mostly after eating.  Denies dysphagia, odynophagia.  No nausea or vomiting.  No blood in the stools. Had cough, that is better. Significant weight gain noted.  Wt Readings from Last 3 Encounters:  12/10/22 289 lb 4 oz (131.2 kg)  11/29/21 240 lb (108.9 kg)  10/30/21 240 lb (108.9 kg)     Review of Systems  Other than above, a 14 point review of systems is negative     Past Medical History:  Diagnosis Date   Allergy    Esophageal reflux    Gastric ulcer    Hypogonadism male    Seasonal allergies 05/23/2012   Sleep apnea    does not wear cpap    Past Surgical History:  Procedure Laterality Date   ORIF ANKLE FRACTURE Left 09/08/2020   UPPER GASTROINTESTINAL ENDOSCOPY     WISDOM TOOTH EXTRACTION     Social History   Socioeconomic History   Marital status: Married    Spouse name: Not on file   Number of children: 1   Years of education: Not on file   Highest education level: Not on file  Occupational History   Occupation: Contractor, [place]  Tobacco Use   Smoking status: Never   Smokeless tobacco: Never  Vaping Use   Vaping Use: Never used  Substance and Sexual Activity   Alcohol use: Yes    Comment: rarely    Drug use: No   Sexual activity: Not on file  Other Topics Concern   Not on file  Social History Narrative   Household-- pt, wife , daughter  2002, graduated from college , lives w/ them      Right handed   Caffeine: "I only drink coffee maybe once every 2 or 3 weeks"   Social Determinants of Health   Financial Resource Strain: Not on file  Food Insecurity: Not on file  Transportation Needs: Not on file  Physical Activity: Not on file  Stress: Not on file  Social Connections: Not on file  Intimate Partner  Violence: Not on file    Current Outpatient Medications  Medication Instructions   pantoprazole (PROTONIX) 40 mg, Oral, Daily   sildenafil (REVATIO) 60-80 mg, Oral, Daily PRN       Objective:   Physical Exam BP 136/72   Pulse 78   Temp 98.2 F (36.8 C) (Oral)   Resp 18   Ht 6' (1.829 m)   Wt 289 lb 4 oz (131.2 kg)   SpO2 97%   BMI 39.23 kg/m  General: Well developed, NAD, BMI noted Neck: No  thyromegaly  HEENT:  Normocephalic . Face symmetric, atraumatic Lungs:  CTA B Normal respiratory effort, no intercostal retractions, no accessory muscle use. Heart: RRR,  no murmur.  Abdomen:  Not distended, soft, non-tender. No rebound or rigidity.   Lower extremities: no pretibial edema bilaterally  Skin: Exposed areas without rash. Not pale. Not jaundice Neurologic:  alert & oriented X3.  Speech normal, gait appropriate for age and unassisted Strength symmetric and appropriate for age.  Psych: Cognition and judgment appear intact.  Cooperative with normal attention span and concentration.  Behavior appropriate. No anxious or depressed appearing.     Assessment     ASSESSMENT GERD, h/o gastric ulcer  H/o hypogonadism Mild, central hypogonadism, saw endo 2014, was rx p Clomid , took x 1 week, no s/e. Brain MRI was  not warranted  Seasonal allergies ED OSA, moderate to severe DX 05-2019, intolerant to CPAP   PLAN Here for CPX - Td 11/2017 -Vaccines I recommend: Shingrix, COVID booster, flu shot (fall). --CCS: Negative I fob 03-2019, C-scope 11/29/2021, next per GI --Prostate cancer screening:   No FH, no symptoms, check PSA. --Diet-exercise: See comments under obesity -Will return fasting for blood work: CMP FLP CBC total testosterone TSH PSA   GERD: Still has occasional symptoms, on PPIs as needed, recommend PPIs daily. Morbid obesity: BMI 39, has OSA.  Per our scale has increased almost 50 pounds in 1 year.  Patient is aware he gained weight despite trying to eat  healthy. We talk about the importance of diet and exercise, I think he needs more structured help, encouraged to call the wellness clinic.  Checking labs including a TSH and a testosterone level. History of hypogonadism: Currently on no supplements, check total testosterone ED: Hardly ever uses sildenafil. OSA: Intolerant to CPAP,   uses mouthguard w/ decreased snoring and increased sleep quality  RTC 4 months.

## 2022-12-11 ENCOUNTER — Other Ambulatory Visit (INDEPENDENT_AMBULATORY_CARE_PROVIDER_SITE_OTHER): Payer: BC Managed Care – PPO

## 2022-12-11 ENCOUNTER — Encounter: Payer: Self-pay | Admitting: Internal Medicine

## 2022-12-11 DIAGNOSIS — E291 Testicular hypofunction: Secondary | ICD-10-CM

## 2022-12-11 DIAGNOSIS — Z0000 Encounter for general adult medical examination without abnormal findings: Secondary | ICD-10-CM

## 2022-12-11 LAB — CBC WITH DIFFERENTIAL/PLATELET
Basophils Absolute: 0 10*3/uL (ref 0.0–0.1)
Basophils Relative: 0.7 % (ref 0.0–3.0)
Eosinophils Absolute: 0.3 10*3/uL (ref 0.0–0.7)
Eosinophils Relative: 5.5 % — ABNORMAL HIGH (ref 0.0–5.0)
HCT: 44.5 % (ref 39.0–52.0)
Hemoglobin: 14.9 g/dL (ref 13.0–17.0)
Lymphocytes Relative: 26.8 % (ref 12.0–46.0)
Lymphs Abs: 1.4 10*3/uL (ref 0.7–4.0)
MCHC: 33.4 g/dL (ref 30.0–36.0)
MCV: 86 fl (ref 78.0–100.0)
Monocytes Absolute: 0.3 10*3/uL (ref 0.1–1.0)
Monocytes Relative: 5.1 % (ref 3.0–12.0)
Neutro Abs: 3.2 10*3/uL (ref 1.4–7.7)
Neutrophils Relative %: 61.9 % (ref 43.0–77.0)
Platelets: 222 10*3/uL (ref 150.0–400.0)
RBC: 5.17 Mil/uL (ref 4.22–5.81)
RDW: 14.3 % (ref 11.5–15.5)
WBC: 5.2 10*3/uL (ref 4.0–10.5)

## 2022-12-11 LAB — COMPREHENSIVE METABOLIC PANEL
ALT: 28 U/L (ref 0–53)
AST: 20 U/L (ref 0–37)
Albumin: 4.2 g/dL (ref 3.5–5.2)
Alkaline Phosphatase: 64 U/L (ref 39–117)
BUN: 14 mg/dL (ref 6–23)
CO2: 25 mEq/L (ref 19–32)
Calcium: 9.4 mg/dL (ref 8.4–10.5)
Chloride: 105 mEq/L (ref 96–112)
Creatinine, Ser: 1.3 mg/dL (ref 0.40–1.50)
GFR: 63.46 mL/min (ref >60.00)
Glucose, Bld: 98 mg/dL (ref 70–99)
Potassium: 4.2 mEq/L (ref 3.5–5.1)
Sodium: 138 mEq/L (ref 135–145)
Total Bilirubin: 0.7 mg/dL (ref 0.2–1.2)
Total Protein: 7.4 g/dL (ref 6.0–8.3)

## 2022-12-11 LAB — LIPID PANEL
Cholesterol: 172 mg/dL (ref 0–200)
HDL: 40.5 mg/dL (ref >39.00)
LDL Cholesterol: 116 mg/dL — ABNORMAL HIGH (ref 0–99)
NonHDL: 131.91
Total CHOL/HDL Ratio: 4
Triglycerides: 79 mg/dL (ref 0.0–149.0)
VLDL: 15.8 mg/dL (ref 0.0–40.0)

## 2022-12-11 LAB — TSH: TSH: 1.15 u[IU]/mL (ref 0.35–5.50)

## 2022-12-11 LAB — TESTOSTERONE: Testosterone: 319.41 ng/dL (ref 300.00–890.00)

## 2022-12-11 LAB — PSA: PSA: 0.68 ng/mL (ref 0.10–4.00)

## 2022-12-11 NOTE — Assessment & Plan Note (Signed)
-   Td 11/2017 -Vaccines I recommend: Shingrix, COVID booster, flu shot (fall). --CCS: Negative I fob 03-2019, C-scope 11/29/2021, next per GI --Prostate cancer screening:   No FH, no symptoms, check PSA. --Diet-exercise: See comments under obesity -Will return fasting for blood work: CMP FLP CBC total testosterone TSH PSA

## 2022-12-11 NOTE — Assessment & Plan Note (Signed)
Here for CPX   GERD: Still has occasional symptoms, on PPIs as needed, recommend PPIs daily. Morbid obesity: BMI 39, has OSA.  Per our scale has increased almost 50 pounds in 1 year.  Patient is aware he gained weight despite trying to eat healthy. We talk about the importance of diet and exercise, I think he needs more structured help, encouraged to call the wellness clinic.  Checking labs including a TSH and a testosterone level. History of hypogonadism: Currently on no supplements, check total testosterone ED: Hardly ever uses sildenafil. OSA: Intolerant to CPAP,   uses mouthguard w/ decreased snoring and increased sleep quality  RTC 4 months.

## 2022-12-12 ENCOUNTER — Encounter: Payer: Self-pay | Admitting: Internal Medicine

## 2022-12-12 MED ORDER — SILDENAFIL CITRATE 20 MG PO TABS: 60.0000 mg | ORAL_TABLET | Freq: Every day | ORAL | 5 refills | Status: DC | PRN

## 2022-12-12 MED ORDER — PANTOPRAZOLE SODIUM 40 MG PO TBEC: 40.0000 mg | DELAYED_RELEASE_TABLET | Freq: Every day | ORAL | 1 refills | Status: DC

## 2023-03-31 ENCOUNTER — Other Ambulatory Visit (HOSPITAL_COMMUNITY): Payer: Self-pay

## 2023-03-31 ENCOUNTER — Telehealth: Payer: Self-pay | Admitting: Pharmacist

## 2023-03-31 NOTE — Telephone Encounter (Signed)
Pharmacy Patient Advocate Encounter  Received notification from [facility] that Prior Authorization for Sildenafil Citrate 20MG  tablets has been DENIED.  See denial reason below. No denial letter attached in CMM. Will attache denial letter to Media tab once received.  This request is not  approved for coverage as it is not being prescribed for a diagnosis that is covered under the patient's current plan approved criteria. Current plan approved criteria will only approve this medication and strength for pulmonary arterial hypertension (PAH) and secondary Raynaud's phenomenon.  PA #/Case ID/Reference #: [id number]

## 2023-04-15 ENCOUNTER — Ambulatory Visit: Payer: BC Managed Care – PPO | Admitting: Internal Medicine

## 2023-04-22 ENCOUNTER — Ambulatory Visit: Payer: BC Managed Care – PPO | Admitting: Internal Medicine

## 2023-04-24 NOTE — Telephone Encounter (Signed)
error 

## 2023-05-14 ENCOUNTER — Encounter: Payer: Self-pay | Admitting: Internal Medicine

## 2023-10-15 ENCOUNTER — Encounter: Payer: Self-pay | Admitting: Internal Medicine

## 2023-10-21 ENCOUNTER — Encounter: Payer: Self-pay | Admitting: Internal Medicine

## 2023-10-21 ENCOUNTER — Ambulatory Visit (INDEPENDENT_AMBULATORY_CARE_PROVIDER_SITE_OTHER): Admitting: Internal Medicine

## 2023-10-21 VITALS — BP 132/86 | HR 83 | Temp 98.3°F | Resp 20 | Ht 72.0 in | Wt 285.1 lb

## 2023-10-21 DIAGNOSIS — Z8616 Personal history of COVID-19: Secondary | ICD-10-CM | POA: Diagnosis not present

## 2023-10-21 DIAGNOSIS — R052 Subacute cough: Secondary | ICD-10-CM

## 2023-10-21 NOTE — Progress Notes (Unsigned)
   Subjective:    Patient ID: Tyrone Freeman, male    DOB: June 14, 1971, 53 y.o.   MRN: [id number]  DOS:  10/21/2023 Type of visit - description: acute   About 3 weeks ago, developed fever, chills, cough, sputum production.  Tested positive for COVID. All symptoms are better except for a persistent cough. He has a small amount of yellowish sputum. No hemoptysis. No chest pain or difficulty breathing.  We also talk about obesity.  Wt Readings from Last 3 Encounters:  10/21/23 285 lb 2 oz (129.3 kg)  12/10/22 289 lb 4 oz (131.2 kg)  11/29/21 240 lb (108.9 kg)     Review of Systems See above   Past Medical History:  Diagnosis Date   Allergy    Esophageal reflux    Gastric ulcer    Hypogonadism male    Seasonal allergies 05/23/2012   Sleep apnea    does not wear cpap    Past Surgical History:  Procedure Laterality Date   ORIF ANKLE FRACTURE Left 09/08/2020   UPPER GASTROINTESTINAL ENDOSCOPY     WISDOM TOOTH EXTRACTION      Current Outpatient Medications  Medication Instructions   pantoprazole  (PROTONIX ) 40 mg, Oral, Daily   sildenafil  (REVATIO ) 60-80 mg, Oral, Daily PRN       Objective:   Physical Exam BP 132/86   Pulse 83   Temp 98.3 F (36.8 C) (Oral)   Resp 20   Ht 6' (1.829 m)   Wt 285 lb 2 oz (129.3 kg)   SpO2 95%   BMI 38.67 kg/m  General:   Well developed, NAD, BMI noted. HEENT:  Normocephalic . Face symmetric, atraumatic TMs: Slightly bulged bilaterally, no red Lungs:  CTA B Normal respiratory effort, no intercostal retractions, no accessory muscle use. Heart: RRR,  no murmur.  Lower extremities: no pretibial edema bilaterally  Skin: Not pale. Not jaundice Neurologic:  alert & oriented X3.  Speech normal, gait appropriate for age and unassisted Psych--  Cognition and judgment appear intact.  Cooperative with normal attention span and concentration.  Behavior appropriate. No anxious or depressed appearing.      Assessment      ASSESSMENT GERD, h/o gastric ulcer H/o hypogonadism Mild, central hypogonadism, saw endo 2014, was rx p Clomid  , took x 1 week, no s/e. Brain MRI was  not warranted  Seasonal allergies ED OSA, moderate to severe DX 05-2019, intolerant to CPAP   PLAN Cough: Self diagnosed with COVID 3 weeks ago, treated conservatively, feeling better except for persisting cough, on exam no evidence of bronchitis or pneumonia.  Recommend to continue using cough drops which help him, Robitussin DM, if not gradually better in the next few days let me know. Morbid obesity: Since the last visit 10 months ago has change a number of things, eating healthier, decrease sweets, walking around 3 times a week for 20 minutes on his treadmill.  On his own scales has lost 10 pounds. Praised!, recommend to continue on the same path. RTC CPX 11/2023

## 2023-10-21 NOTE — Patient Instructions (Addendum)
   Next office visit for a physical exam by June 2025 Please make an appointment before you leave today

## 2023-10-23 NOTE — Assessment & Plan Note (Signed)
 Cough: Self diagnosed with COVID 3 weeks ago, treated conservatively, feeling better except for persisting cough, on exam no evidence of bronchitis or pneumonia.  Recommend to continue using cough drops which help him, Robitussin DM, if not gradually better in the next few days let me know. Morbid obesity: Since the last visit 10 months ago has change a number of things, eating healthier, decrease sweets, walking around 3 times a week for 20 minutes on his treadmill.  On his own scales has lost 10 pounds. Praised!, recommend to continue on the same path. RTC CPX 11/2023

## 2023-11-04 ENCOUNTER — Encounter: Payer: Self-pay | Admitting: Internal Medicine

## 2023-11-16 ENCOUNTER — Encounter: Payer: Self-pay | Admitting: Internal Medicine

## 2023-11-18 MED ORDER — BENZONATATE 200 MG PO CAPS: 200.0000 mg | ORAL_CAPSULE | Freq: Three times a day (TID) | ORAL | 0 refills | Status: DC | PRN

## 2023-12-21 ENCOUNTER — Other Ambulatory Visit: Payer: Self-pay | Admitting: Internal Medicine

## 2023-12-22 ENCOUNTER — Ambulatory Visit (INDEPENDENT_AMBULATORY_CARE_PROVIDER_SITE_OTHER): Admitting: Internal Medicine

## 2023-12-22 VITALS — BP 132/80 | HR 74 | Temp 98.3°F | Resp 16 | Ht 72.0 in | Wt 289.5 lb

## 2023-12-22 DIAGNOSIS — G4733 Obstructive sleep apnea (adult) (pediatric): Secondary | ICD-10-CM

## 2023-12-22 DIAGNOSIS — Z0000 Encounter for general adult medical examination without abnormal findings: Secondary | ICD-10-CM

## 2023-12-22 DIAGNOSIS — Z23 Encounter for immunization: Secondary | ICD-10-CM

## 2023-12-22 NOTE — Patient Instructions (Signed)
 It was good to see you today.  Vaccines to consider: A COVID booster Flu shot every fall The shingles shot (Shingrix) A pneumonia shot  GO TO THE LAB :  Get the blood work   Your results will be posted on MyChart with my comments  Next office visit for a physical exam in 1 year Please make an appointment before you leave today    Health Care Power of attorney (Also know as a  Living will or  Advance care planning documents)  If you already have a living will or healthcare power of attorney, is recommended you bring the copy to be scanned in your chart.   The document will be available to all the doctors you see in the system.  If you are over 53 y/o and don't have the document, please read:  Advance care planning is a process that supports adults in  understanding and sharing their preferences regarding future medical care.  The patient's preferences are recorded in documents called Advance Directives and the can be modified at any time while the patient is in full mental capacity.     More information at: [email or web address]

## 2023-12-22 NOTE — Progress Notes (Unsigned)
 Subjective:    Patient ID: Tyrone Freeman, male    DOB: May 21, 1971, 53 y.o.   MRN: [id number]  DOS:  12/22/2023 Type of visit - description: CPX  Here for CPX. In general feels well. Denies chest pain or difficulty breathing.  History of sleep apnea, not using a CPAP, feeling okay.  Wt Readings from Last 3 Encounters:  12/22/23 289 lb 8 oz (131.3 kg)  10/21/23 285 lb 2 oz (129.3 kg)  12/10/22 289 lb 4 oz (131.2 kg)     Review of Systems  Other than above, a 14 point review of systems is negative    Past Medical History:  Diagnosis Date   Allergy    Esophageal reflux    Gastric ulcer    Hypogonadism male    Seasonal allergies 05/23/2012   Sleep apnea    does not wear cpap    Past Surgical History:  Procedure Laterality Date   ORIF ANKLE FRACTURE Left 09/08/2020   UPPER GASTROINTESTINAL ENDOSCOPY     WISDOM TOOTH EXTRACTION     Social History   Social History Narrative   Household-- pt, wife , daughter  2002, graduated from college , lives w/ them      Right handed   Caffeine: I only drink coffee maybe once every 2 or 3 weeks     Current Outpatient Medications  Medication Instructions   pantoprazole  (PROTONIX ) 40 mg, Oral, Daily   sildenafil  (REVATIO ) 60-80 mg, Oral, Daily PRN       Objective:   Physical Exam BP 132/80   Pulse 74   Temp 98.3 F (36.8 C) (Oral)   Resp 16   Ht 6' (1.829 m)   Wt 289 lb 8 oz (131.3 kg)   SpO2 97%   BMI 39.26 kg/m  General: Well developed, NAD, BMI noted Neck: No  thyromegaly  HEENT:  Normocephalic . Face symmetric, atraumatic Lungs:  CTA B Normal respiratory effort, no intercostal retractions, no accessory muscle use. Heart: RRR,  no murmur.  Abdomen:  Not distended, soft, non-tender. No rebound or rigidity.   Lower extremities: no pretibial edema bilaterally  Skin: Exposed areas without rash. Not pale. Not jaundice Neurologic:  alert & oriented X3.  Speech normal, gait appropriate for age and  unassisted Strength symmetric and appropriate for age.  Psych: Cognition and judgment appear intact.  Cooperative with normal attention span and concentration.  Behavior appropriate. No anxious or depressed appearing.     Assessment     ASSESSMENT GERD, h/o gastric ulcer H/o hypogonadism Mild, central hypogonadism, saw endo 2014, was rx p Clomid  , took x 1 week, no s/e. Brain MRI was  not warranted  Seasonal allergies ED OSA, moderate to severe DX 05-2019, intolerant to CPAP, uses a mouth piece as off 10/2023   PLAN Here for CPX -Td 11/2017 - PNM 20: Today (OSA) -Vaccines I recommend: Shingrix, COVID booster, flu shot (fall). --CCS: Negative I fob 03-2019, C-scope 11/29/2021, next per GI --Prostate cancer screening:   No FH, no symptoms, check PSA. --Diet-exercise: See comments under obesity - Labs: CMP FLP CBC PSA, hep B serology Other issues Hypogonadism: h/o , not on HRT, last total testosterone  normal OSA: CPAP intolerant, uses a mouthpiece, snoring is somewhat better, energy is okay, not falling asleep inappropriately. Morbid obesity: Despite his best efforts he has not been able to lose weight.  Has an appointment to be seen at the wellness clinic.  GLP-1's are an option in his case given BMI -  OSA, CPAP intolerance. RTC 1 year

## 2023-12-23 ENCOUNTER — Encounter: Payer: Self-pay | Admitting: Internal Medicine

## 2023-12-23 ENCOUNTER — Other Ambulatory Visit (INDEPENDENT_AMBULATORY_CARE_PROVIDER_SITE_OTHER)

## 2023-12-23 ENCOUNTER — Ambulatory Visit: Payer: Self-pay | Admitting: Internal Medicine

## 2023-12-23 DIAGNOSIS — Z0000 Encounter for general adult medical examination without abnormal findings: Secondary | ICD-10-CM | POA: Diagnosis not present

## 2023-12-23 DIAGNOSIS — Z23 Encounter for immunization: Secondary | ICD-10-CM

## 2023-12-23 LAB — CBC WITH DIFFERENTIAL/PLATELET
Basophils Absolute: 0 10*3/uL (ref 0.0–0.1)
Basophils Relative: 0.7 % (ref 0.0–3.0)
Eosinophils Absolute: 0.4 10*3/uL (ref 0.0–0.7)
Eosinophils Relative: 5.8 % — ABNORMAL HIGH (ref 0.0–5.0)
HCT: 45.8 % (ref 39.0–52.0)
Hemoglobin: 15.8 g/dL (ref 13.0–17.0)
Lymphocytes Relative: 23.5 % (ref 12.0–46.0)
Lymphs Abs: 1.5 10*3/uL (ref 0.7–4.0)
MCHC: 34.5 g/dL (ref 30.0–36.0)
MCV: 84.8 fl (ref 78.0–100.0)
Monocytes Absolute: 0.3 10*3/uL (ref 0.1–1.0)
Monocytes Relative: 4.2 % (ref 3.0–12.0)
Neutro Abs: 4.1 10*3/uL (ref 1.4–7.7)
Neutrophils Relative %: 65.8 % (ref 43.0–77.0)
Platelets: 229 10*3/uL (ref 150.0–400.0)
RBC: 5.4 Mil/uL (ref 4.22–5.81)
RDW: 14 % (ref 11.5–15.5)
WBC: 6.2 10*3/uL (ref 4.0–10.5)

## 2023-12-23 LAB — COMPREHENSIVE METABOLIC PANEL WITH GFR
ALT: 25 U/L (ref 0–53)
AST: 19 U/L (ref 0–37)
Albumin: 4.2 g/dL (ref 3.5–5.2)
Alkaline Phosphatase: 71 U/L (ref 39–117)
BUN: 13 mg/dL (ref 6–23)
CO2: 26 meq/L (ref 19–32)
Calcium: 9.7 mg/dL (ref 8.4–10.5)
Chloride: 104 meq/L (ref 96–112)
Creatinine, Ser: 1.47 mg/dL (ref 0.40–1.50)
GFR: 54.36 mL/min — ABNORMAL LOW (ref >60.00)
Glucose, Bld: 104 mg/dL — ABNORMAL HIGH (ref 70–99)
Potassium: 4.2 meq/L (ref 3.5–5.1)
Sodium: 138 meq/L (ref 135–145)
Total Bilirubin: 0.8 mg/dL (ref 0.2–1.2)
Total Protein: 7.2 g/dL (ref 6.0–8.3)

## 2023-12-23 LAB — LIPID PANEL
Cholesterol: 182 mg/dL (ref 0–200)
HDL: 47.7 mg/dL (ref >39.00)
LDL Cholesterol: 104 mg/dL — ABNORMAL HIGH (ref 0–99)
NonHDL: 134.2
Total CHOL/HDL Ratio: 4
Triglycerides: 149 mg/dL (ref 0.0–149.0)
VLDL: 29.8 mg/dL (ref 0.0–40.0)

## 2023-12-23 LAB — PSA: PSA: 0.74 ng/mL (ref 0.10–4.00)

## 2023-12-23 NOTE — Assessment & Plan Note (Signed)
 Here for CPX -Td 11/2017 - PNM 20: Today (OSA) -Vaccines I recommend: Shingrix, COVID booster, flu shot (fall). --CCS: Negative I fob 03-2019, C-scope 11/29/2021, next per GI --Prostate cancer screening:   No FH, no symptoms, check PSA. --Diet-exercise: See comments under obesity - Labs: CMP FLP CBC PSA, hep B serology

## 2023-12-23 NOTE — Assessment & Plan Note (Signed)
 Here for CPX  Other issues Hypogonadism: h/o , not on HRT, last total testosterone  normal OSA: CPAP intolerant, uses a mouthpiece, snoring is somewhat better, energy is okay, not falling asleep inappropriately. Morbid obesity: Despite his best efforts he has not been able to lose weight.  Has an appointment to be seen at the wellness clinic.  GLP-1's are an option in his case given BMI - OSA, CPAP intolerance. RTC 1 year

## 2023-12-24 LAB — HEPATITIS B SURFACE ANTIBODY, QUANTITATIVE: Hep B S AB Quant (Post): 38 m[IU]/mL (ref >=10)

## 2023-12-28 ENCOUNTER — Ambulatory Visit (INDEPENDENT_AMBULATORY_CARE_PROVIDER_SITE_OTHER): Payer: Self-pay | Admitting: Adult Health

## 2023-12-28 ENCOUNTER — Encounter (INDEPENDENT_AMBULATORY_CARE_PROVIDER_SITE_OTHER): Payer: Self-pay

## 2023-12-28 ENCOUNTER — Encounter (INDEPENDENT_AMBULATORY_CARE_PROVIDER_SITE_OTHER): Payer: Self-pay | Admitting: Adult Health

## 2023-12-28 VITALS — BP 136/84 | HR 77 | Temp 98.2°F | Ht 72.0 in | Wt 286.0 lb

## 2023-12-28 DIAGNOSIS — R944 Abnormal results of kidney function studies: Secondary | ICD-10-CM

## 2023-12-28 DIAGNOSIS — G4733 Obstructive sleep apnea (adult) (pediatric): Secondary | ICD-10-CM

## 2023-12-28 DIAGNOSIS — E559 Vitamin D deficiency, unspecified: Secondary | ICD-10-CM | POA: Diagnosis not present

## 2023-12-28 DIAGNOSIS — Z0289 Encounter for other administrative examinations: Secondary | ICD-10-CM

## 2023-12-28 DIAGNOSIS — Z6838 Body mass index (BMI) 38.0-38.9, adult: Secondary | ICD-10-CM

## 2023-12-28 NOTE — Progress Notes (Signed)
 Office: [phone]  /  Fax: [phone]   Initial Visit    Tyrone Freeman was seen in clinic today to evaluate for obesity. He is interested in losing weight to improve overall health and reduce the risk of weight related complications. He presents today to review program treatment options, initial physical assessment, and evaluation.     He was referred by: PCP  When asked what else they would like to accomplish? He states: Adopt a healthier eating pattern and lifestyle, Improve energy levels and physical activity, Improve existing medical conditions, Improve quality of life, and Current Weight 286 lbs, Goal Weight 215  lbs  When asked how has your weight affected you? He states: Contributed to orthopedic problems or mobility issues, Having fatigue, Having poor endurance, and Problems with eating patterns  Weight history: Steady weight gain since Jan 2021- suffered traumatic L ankle injury, required surgical repair  Highest weight: 286 lbs  Some associated conditions: Hyperlipidemia  Contributing factors: disruption of circadian rhythm / sleep disordered breathing, consumption of processed foods, moderate to high levels of stress, reduced physical activity, sedentary job, and hectic pace of life  Weight promoting medications identified: None  Prior weight loss attempts: None  Current nutrition plan: Portion control / smart choices  Current level of physical activity: Walking 20 minutes, once a week  Current or previous pharmacotherapy: None  Response to medication: Never tried medications   Past medical history includes:   Past Medical History:  Diagnosis Date   Allergy    Esophageal reflux    Gastric ulcer    Hypogonadism male    Seasonal allergies 05/23/2012   Sleep apnea    does not wear cpap     Objective    BP 136/84   Pulse 77   Temp 98.2 F (36.8 C)   Ht 6' (1.829 m)   Wt 286 lb (129.7 kg)   SpO2 96%   BMI 38.79 kg/m  He was weighed on the  bioimpedance scale: Body mass index is 38.79 kg/m.  Body Fat%:30.7, Visceral Fat Rating:18, Weight trend over the last 12 months: Increasing  General:  Alert, oriented and cooperative. Patient is in no acute distress.  Respiratory: Normal respiratory effort, no problems with respiration noted   Gait: able to ambulate independently  Mental Status: Normal mood and affect. Normal behavior. Normal judgment and thought content.   DIAGNOSTIC DATA REVIEWED:  BMET    Component Value Date/Time   NA 138 12/23/2023 0922   K 4.2 12/23/2023 0922   CL 104 12/23/2023 0922   CO2 26 12/23/2023 0922   GLUCOSE 104 (H) 12/23/2023 0922   BUN 13 12/23/2023 0922   CREATININE 1.47 12/23/2023 0922   CALCIUM 9.7 12/23/2023 0922   GFRNONAA 79.37 04/20/2009 1034   GFRAA 73 02/17/2008 1005   No results found for: HGBA1C No results found for: INSULIN CBC    Component Value Date/Time   WBC 6.2 12/23/2023 0922   RBC 5.40 12/23/2023 0922   HGB 15.8 12/23/2023 0922   HCT 45.8 12/23/2023 0922   PLT 229.0 12/23/2023 0922   MCV 84.8 12/23/2023 0922   MCHC 34.5 12/23/2023 0922   RDW 14.0 12/23/2023 0922   Iron/TIBC/Ferritin/ %Sat    Component Value Date/Time   IRON 104 08/04/2012 0916   IRONPCTSAT 30.1 08/04/2012 0916   Lipid Panel     Component Value Date/Time   CHOL 182 12/23/2023 0922   TRIG 149.0 12/23/2023 0922   HDL 47.70 12/23/2023 9077  CHOLHDL 4 12/23/2023 0922   VLDL 29.8 12/23/2023 0922   LDLCALC 104 (H) 12/23/2023 0922   Hepatic Function Panel     Component Value Date/Time   PROT 7.2 12/23/2023 0922   ALBUMIN 4.2 12/23/2023 0922   AST 19 12/23/2023 0922   ALT 25 12/23/2023 0922   ALKPHOS 71 12/23/2023 0922   BILITOT 0.8 12/23/2023 0922   BILIDIR 0.2 02/17/2008 1005      Component Value Date/Time   TSH 1.15 12/11/2022 0930     Assessment and Plan   OSA (obstructive sleep apnea)  Decreased calculated GFR  Vitamin D  deficiency  Morbid obesity (HCC), STRTING BMI  38.8    Assessment and Plan          ESTABLISH WITH [facility]     Obesity Treatment / Action Plan:  Patient will work on garnering support from family and friends to begin weight loss journey. Will work on eliminating or reducing the presence of highly palatable, calorie dense foods in the home. Will complete provided nutritional and psychosocial assessment questionnaire before the next appointment. Will be scheduled for indirect calorimetry to determine resting energy expenditure in a fasting state.  This will allow us  to create a reduced calorie, high-protein meal plan to promote loss of fat mass while preserving muscle mass. Counseled on the health benefits of losing 5%-15% of total body weight. Was counseled on nutritional approaches to weight loss and benefits of reducing processed foods and consuming plant-based foods and high quality protein as part of nutritional weight management. Was counseled on pharmacotherapy and role as an adjunct in weight management.   Obesity Education Performed Today:  He was weighed on the bioimpedance scale and results were discussed and documented in the synopsis.  We discussed obesity as a disease and the importance of a more detailed evaluation of all the factors contributing to the disease.  We discussed the importance of long term lifestyle changes which include nutrition, exercise and behavioral modifications as well as the importance of customizing this to his specific health and social needs.  We discussed the benefits of reaching a healthier weight to alleviate the symptoms of existing conditions and reduce the risks of the biomechanical, metabolic and psychological effects of obesity.  We reviewed the four pillars of obesity medicine and importance of using a multimodal approach.  We reviewed the basic principles in weight management.   Tyrone Freeman appears to be in the action stage of change and states they are ready to start intensive  lifestyle modifications and behavioral modifications.  I have spent 20 minutes in the care of the patient today including: 5 minutes before the visit reviewing and preparing the chart. 18 minutes face-to-face assessing and reviewing listed medical problems as outlined in obesity care plan, providing nutritional and behavioral counseling on topics outlined in the obesity care plan, counseling regarding anti-obesity medication as outlined in obesity care plan, independently interpreting test results and goals of care, as described in assessment and plan, and reviewing and discussing biometric information and progress 2 minutes after the visit updating chart and documentation of encounter.  Reviewed by clinician on day of visit: allergies, medications, problem list, medical history, surgical history, family history, social history, and previous encounter notes pertinent to obesity diagnosis.  Marciel Offenberger d. Ringo Sherod, NP-C

## 2024-02-03 ENCOUNTER — Ambulatory Visit (INDEPENDENT_AMBULATORY_CARE_PROVIDER_SITE_OTHER): Admitting: Family Medicine

## 2024-02-03 ENCOUNTER — Encounter (INDEPENDENT_AMBULATORY_CARE_PROVIDER_SITE_OTHER): Payer: Self-pay | Admitting: Family Medicine

## 2024-02-03 ENCOUNTER — Other Ambulatory Visit (INDEPENDENT_AMBULATORY_CARE_PROVIDER_SITE_OTHER): Payer: Self-pay | Admitting: Family Medicine

## 2024-02-03 VITALS — BP 138/83 | HR 80 | Temp 98.7°F | Ht 70.5 in | Wt 288.0 lb

## 2024-02-03 DIAGNOSIS — R0609 Other forms of dyspnea: Secondary | ICD-10-CM | POA: Diagnosis not present

## 2024-02-03 DIAGNOSIS — E669 Obesity, unspecified: Secondary | ICD-10-CM

## 2024-02-03 DIAGNOSIS — R030 Elevated blood-pressure reading, without diagnosis of hypertension: Secondary | ICD-10-CM

## 2024-02-03 DIAGNOSIS — R739 Hyperglycemia, unspecified: Secondary | ICD-10-CM | POA: Diagnosis not present

## 2024-02-03 DIAGNOSIS — K219 Gastro-esophageal reflux disease without esophagitis: Secondary | ICD-10-CM

## 2024-02-03 DIAGNOSIS — N189 Chronic kidney disease, unspecified: Secondary | ICD-10-CM

## 2024-02-03 DIAGNOSIS — R5383 Other fatigue: Secondary | ICD-10-CM | POA: Diagnosis not present

## 2024-02-03 DIAGNOSIS — G4733 Obstructive sleep apnea (adult) (pediatric): Secondary | ICD-10-CM

## 2024-02-03 DIAGNOSIS — E785 Hyperlipidemia, unspecified: Secondary | ICD-10-CM

## 2024-02-03 DIAGNOSIS — Z1331 Encounter for screening for depression: Secondary | ICD-10-CM

## 2024-02-03 DIAGNOSIS — Z6841 Body Mass Index (BMI) 40.0 and over, adult: Secondary | ICD-10-CM

## 2024-02-03 NOTE — Progress Notes (Signed)
 Office: [phone]  /  Fax: [phone]  WEIGHT SUMMARY AND BIOMETRICS  Anthropometric Measurements Height: 5' 10.5 (1.791 m) Weight: 288 lb (130.6 kg) BMI (Calculated): 40.73 Weight at Last Visit: 0lb Weight Lost Since Last Visit: 0lb Weight Gained Since Last Visit: 0lb Starting Weight: 0lb Total Weight Loss (lbs): 0 lb (0 kg) Peak Weight: 295lb Waist Measurement : 50 inches   Body Composition  Body Fat %: 33.5 % Fat Mass (lbs): 96.8 lbs Muscle Mass (lbs): 182.6 lbs Total Body Water (lbs): 133.6 lbs Visceral Fat Rating : 21   Other Clinical Data RMR: 2002 Fasting: Yes Labs: Yes Today's Visit #: 1 Starting Date: 02/03/24 Comments: First Visit    Chief Complaint: OBESITY   History of Present Illness Tyrone Freeman is a 53 year old male with obesity who presents for a workup to determine effective treatment options.  He experiences fatigue that worsens with weight gain, mild shortness of breath with exertion such as climbing stairs, and gastroesophageal reflux disease (GERD). Recent laboratory tests indicate hyperglycemia with elevated fasting glucose levels and mildly elevated LDL cholesterol.  He has a history of severe sleep apnea with an apnea-hypopnea index (AHI) of 28.3 from a home sleep study conducted on May 18, 2019. He is not currently using a CPAP machine, having tried it previously without success.  His work life is very complex and busy, with late days and early mornings. He typically leaves the house around 8:00 AM and returns around 7:30 PM. He often eats dinner alone due to his schedule but lives with his wife and 48 year old daughter, and he usually eats dinner together when possible.  He does not experience excessive hunger but finds meal planning and preparation challenging. He admits to some evening snacking but denies emotional eating behaviors. He does not currently weigh himself at home. He exercises regularly but is not currently  increasing his activity level.  He occasionally takes a multivitamin once or twice a week.  TESTS:  ECG NSR 63 BPM REE 2002 BMR 2659 Epworth 13 Modified PHQ-9 7      PHYSICAL EXAM:  Blood pressure 138/83, pulse 80, temperature 98.7 F (37.1 C), height 5' 10.5 (1.791 m), weight 288 lb (130.6 kg), SpO2 100%. Body mass index is 40.74 kg/m.  DIAGNOSTIC DATA REVIEWED:  BMET    Component Value Date/Time   NA 138 12/23/2023 0922   K 4.2 12/23/2023 0922   CL 104 12/23/2023 0922   CO2 26 12/23/2023 0922   GLUCOSE 104 (H) 12/23/2023 0922   BUN 13 12/23/2023 0922   CREATININE 1.47 12/23/2023 0922   CALCIUM 9.7 12/23/2023 0922   GFRNONAA 79.37 04/20/2009 1034   GFRAA 73 02/17/2008 1005   No results found for: HGBA1C No results found for: INSULIN  Lab Results  Component Value Date   TSH 1.15 12/11/2022   CBC    Component Value Date/Time   WBC 6.2 12/23/2023 0922   RBC 5.40 12/23/2023 0922   HGB 15.8 12/23/2023 0922   HCT 45.8 12/23/2023 0922   PLT 229.0 12/23/2023 0922   MCV 84.8 12/23/2023 0922   MCHC 34.5 12/23/2023 0922   RDW 14.0 12/23/2023 0922   Iron Studies    Component Value Date/Time   IRON 104 08/04/2012 0916   IRONPCTSAT 30.1 08/04/2012 0916   Lipid Panel     Component Value Date/Time   CHOL 182 12/23/2023 0922   TRIG 149.0 12/23/2023 0922   HDL 47.70 12/23/2023 0922   CHOLHDL 4 12/23/2023 9077  VLDL 29.8 12/23/2023 0922   LDLCALC 104 (H) 12/23/2023 0922   Hepatic Function Panel     Component Value Date/Time   PROT 7.2 12/23/2023 0922   ALBUMIN 4.2 12/23/2023 0922   AST 19 12/23/2023 0922   ALT 25 12/23/2023 0922   ALKPHOS 71 12/23/2023 0922   BILITOT 0.8 12/23/2023 0922   BILIDIR 0.2 02/17/2008 1005      Component Value Date/Time   TSH 1.15 12/11/2022 0930   Nutritional No results found for: VD25OH   Assessment and Plan Assessment & Plan Obesity Obesity with comorbidities including fatigue, dyspnea on exertion, GERD,  sleep apnea, hyperglycemia, and hyperlipidemia. Metabolism is significantly lower than expected, likely due to untreated sleep apnea and other factors. Discussed genetic and physiological mechanisms contributing to obesity and the role of diet as a form of medicine. - Initiate a category three eating plan with specific meal options and a grocery list for the next two weeks. - Educate on the use of a food scale for measuring protein portions. - Advise to consume all prescribed food items by the end of the day, regardless of meal timing. - Allow 300 calories for discretionary use to prevent feelings of deprivation. - Instruct to hydrate adequately to prevent dehydration, especially during weight loss. - Advise against weighing himself at home to avoid mood fluctuations based on weight changes.  Obstructive sleep apnea untreated with an AHI of 28.3 on May 18, 2019 Severe obstructive sleep apnea with an AHI of 28.3, untreated. Discussed the impact of untreated sleep apnea on metabolism and weight gain, creating a cycle that exacerbates both conditions. Emphasized the importance of CPAP therapy in managing sleep apnea and its potential benefits on metabolism and weight loss. - [facility]ise [facility] discuss resuming [facility] therapy. - Provide tips for CPAP mask fitting and usage to improve tolerance.  Hyperglycemia Recent labs indicate hyperglycemia with fasting glucose slightly elevated above 100 mg/dL. Discussed the potential impact of diet and weight loss on improving glucose levels. - Order additional tests to assess how the body handles certain foods and sugars.  Pure hyperlipidemia, improving not on statin Mildly elevated LDL at 104 mg/dL, improving without statin therapy. Triglycerides at 149 mg/dL, indicating borderline carbohydrate handling. HDL at 47 mg/dL, within normal range. Discussed the potential for further improvement with dietary changes and weight loss. -  Monitor lipid levels as part of ongoing management.  Mild dyspnea on exertion Mild dyspnea on exertion likely related to obesity and deconditioning. No significant findings on EKG. Likely secondary to mild exercise intolerance -Work on weight loss -Continue exercise as is for now  Fatigue Fatigue associated with obesity and possibly exacerbated by untreated sleep apnea.  Chronic kidney disease, unspecified (borderline decreased kidney function) Borderline decreased kidney function with GFR occasionally below 60. Discussed the potential impact of hypertension and untreated sleep apnea on kidney function. Emphasized the importance of monitoring and preventing further decline. - Recheck kidney function with upcoming labs.  Prehypertension Prehypertension likely exacerbated by untreated sleep apnea. Discussed the potential for improvement with CPAP therapy and weight loss. - Monitor blood pressure as part of ongoing management.     I have personally spent 50 minutes total time today in preparation, patient care, and documentation for this visit, including the following: review of clinical lab tests; review of medical history, review of the pathophysiology of obesity and nutritional counseling done today   He was informed of the importance of frequent follow up visits to maximize  his success with intensive lifestyle modifications for his multiple health conditions.    [name], MD

## 2024-02-04 LAB — CMP14+EGFR
ALT: 23 IU/L (ref 0–44)
AST: 22 IU/L (ref 0–40)
Albumin: 4.2 g/dL (ref 3.8–4.9)
Alkaline Phosphatase: 76 IU/L (ref 44–121)
BUN/Creatinine Ratio: 10 (ref 9–20)
BUN: 14 mg/dL (ref 6–24)
Bilirubin Total: 0.8 mg/dL (ref 0.0–1.2)
CO2: 22 mmol/L (ref 20–29)
Calcium: 9.4 mg/dL (ref 8.7–10.2)
Chloride: 102 mmol/L (ref 96–106)
Creatinine, Ser: 1.46 mg/dL — ABNORMAL HIGH (ref 0.76–1.27)
Globulin, Total: 3.1 g/dL (ref 1.5–4.5)
Glucose: 86 mg/dL (ref 70–99)
Potassium: 4.3 mmol/L (ref 3.5–5.2)
Sodium: 138 mmol/L (ref 134–144)
Total Protein: 7.3 g/dL (ref 6.0–8.5)
eGFR: 58 mL/min/1.73 — ABNORMAL LOW (ref >59)

## 2024-02-04 LAB — TSH: TSH: 1.28 u[IU]/mL (ref 0.450–4.500)

## 2024-02-04 LAB — VITAMIN D 25 HYDROXY (VIT D DEFICIENCY, FRACTURES): Vit D, 25-Hydroxy: 30.9 ng/mL (ref 30.0–100.0)

## 2024-02-04 LAB — FOLATE: Folate: 4.9 ng/mL (ref >3.0)

## 2024-02-04 LAB — MICROALBUMIN / CREATININE URINE RATIO
Creatinine, Urine: 233.7 mg/dL
Microalb/Creat Ratio: 23 mg/g{creat} (ref 0–29)
Microalbumin, Urine: 54.2 ug/mL

## 2024-02-04 LAB — VITAMIN B12: Vitamin B-12: 368 pg/mL (ref 232–1245)

## 2024-02-04 LAB — MAGNESIUM: Magnesium: 2.1 mg/dL (ref 1.6–2.3)

## 2024-02-04 LAB — HEMOGLOBIN A1C
Est. average glucose Bld gHb Est-mCnc: 120 mg/dL
Hgb A1c MFr Bld: 5.8 % — ABNORMAL HIGH (ref 4.8–5.6)

## 2024-02-04 LAB — INSULIN, RANDOM: INSULIN: 24.5 u[IU]/mL (ref 2.6–24.9)

## 2024-02-09 ENCOUNTER — Institutional Professional Consult (permissible substitution) (INDEPENDENT_AMBULATORY_CARE_PROVIDER_SITE_OTHER): Payer: Self-pay | Admitting: Adult Health

## 2024-02-17 ENCOUNTER — Ambulatory Visit (INDEPENDENT_AMBULATORY_CARE_PROVIDER_SITE_OTHER): Admitting: Family Medicine

## 2024-02-17 ENCOUNTER — Encounter (INDEPENDENT_AMBULATORY_CARE_PROVIDER_SITE_OTHER): Payer: Self-pay | Admitting: Family Medicine

## 2024-02-17 VITALS — BP 178/124 | HR 71 | Temp 97.7°F | Ht 70.5 in | Wt 281.0 lb

## 2024-02-17 DIAGNOSIS — N1831 Chronic kidney disease, stage 3a: Secondary | ICD-10-CM | POA: Diagnosis not present

## 2024-02-17 DIAGNOSIS — E538 Deficiency of other specified B group vitamins: Secondary | ICD-10-CM | POA: Diagnosis not present

## 2024-02-17 DIAGNOSIS — Z6841 Body Mass Index (BMI) 40.0 and over, adult: Secondary | ICD-10-CM

## 2024-02-17 DIAGNOSIS — R030 Elevated blood-pressure reading, without diagnosis of hypertension: Secondary | ICD-10-CM | POA: Diagnosis not present

## 2024-02-17 DIAGNOSIS — E559 Vitamin D deficiency, unspecified: Secondary | ICD-10-CM | POA: Diagnosis not present

## 2024-02-17 DIAGNOSIS — Z6839 Body mass index (BMI) 39.0-39.9, adult: Secondary | ICD-10-CM

## 2024-02-17 DIAGNOSIS — E669 Obesity, unspecified: Secondary | ICD-10-CM

## 2024-02-17 DIAGNOSIS — R7303 Prediabetes: Secondary | ICD-10-CM

## 2024-02-17 MED ORDER — VITAMIN B-12 1000 MCG PO TABS: 1000.0000 ug | ORAL_TABLET | Freq: Every day | ORAL | 0 refills | Status: AC

## 2024-02-17 MED ORDER — METFORMIN HCL 500 MG PO TABS: 500.0000 mg | ORAL_TABLET | Freq: Every day | ORAL | 0 refills | Status: DC

## 2024-02-17 MED ORDER — VITAMIN D (ERGOCALCIFEROL) 1.25 MG (50000 UNIT) PO CAPS: 50000.0000 [IU] | ORAL_CAPSULE | ORAL | 0 refills | Status: DC

## 2024-02-17 NOTE — Progress Notes (Signed)
 Office: [phone]  /  Fax: [phone]  WEIGHT SUMMARY AND BIOMETRICS  Anthropometric Measurements Height: 5' 10.5 (1.791 m) Weight: 281 lb (127.5 kg) BMI (Calculated): 39.74 Weight at Last Visit: 288 lb Weight Lost Since Last Visit: 7 lb Weight Gained Since Last Visit: 0 Starting Weight: 288 lb Total Weight Loss (lbs): 7 lb (3.175 kg) Peak Weight: 295 lb Waist Measurement : 50 inches   Body Composition  Body Fat %: 32.6 % Fat Mass (lbs): 91.8 lbs Muscle Mass (lbs): 180.8 lbs Total Body Water (lbs): 130.6 lbs Visceral Fat Rating : 20   Other Clinical Data RMR: 2002 Fasting: no Labs: no Today's Visit #: 2 Starting Date: 02/03/24 Comments: cat 3    Chief Complaint: OBESITY   History of Present Illness Tyrone Freeman is a 53 year old male who presents for a follow-up to review test results and customize an eating plan.  He has been following a category three eating plan, adhering to it 90% of the time. Initially, he experienced difficulty with hunger during the first two to three days but adjusted by modifying meal timing and increasing protein snacks. He has lost seven pounds in the last two weeks.  He is currently engaging in physical activity by using the treadmill two days a week for twenty minutes each session. He feels healthier, with less puffiness in his fingers and improved sleep quality.  Recent lab results show normal electrolytes, stable creatinine at 1.46, and a GFR that improved from 54 to 58. Liver function tests were normal, and a urine microalbumin creatinine ratio was normal. Vitamin D  levels were low at 30.9, and B12 levels were at 368. His fasting glucose was normal, but his hemoglobin A1c was 5.8. His fasting insulin  level was elevated at 24.5.  He has joined the [employer] and plans to start exercising there as it is conveniently located near his new office. He recently received a job promotion, which has introduced new roles and activities,  contributing to stress. He has also lost his Apple watch, which has been a source of stress for the past week or two.      PHYSICAL EXAM:  Blood pressure (!) 178/124, pulse 71, temperature 97.7 F (36.5 C), height 5' 10.5 (1.791 m), weight 281 lb (127.5 kg), SpO2 98%. Body mass index is 39.75 kg/m.  DIAGNOSTIC DATA REVIEWED:  BMET    Component Value Date/Time   NA 138 02/03/2024 1009   K 4.3 02/03/2024 1009   CL 102 02/03/2024 1009   CO2 22 02/03/2024 1009   GLUCOSE 86 02/03/2024 1009   GLUCOSE 104 (H) 12/23/2023 0922   BUN 14 02/03/2024 1009   CREATININE 1.46 (H) 02/03/2024 1009   CALCIUM 9.4 02/03/2024 1009   GFRNONAA 79.37 04/20/2009 1034   GFRAA 73 02/17/2008 1005   Lab Results  Component Value Date   HGBA1C 5.8 (H) 02/03/2024   Lab Results  Component Value Date   INSULIN  24.5 02/03/2024   Lab Results  Component Value Date   TSH 1.280 02/03/2024   CBC    Component Value Date/Time   WBC 6.2 12/23/2023 0922   RBC 5.40 12/23/2023 0922   HGB 15.8 12/23/2023 0922   HCT 45.8 12/23/2023 0922   PLT 229.0 12/23/2023 0922   MCV 84.8 12/23/2023 0922   MCHC 34.5 12/23/2023 0922   RDW 14.0 12/23/2023 0922   Iron Studies    Component Value Date/Time   IRON 104 08/04/2012 0916   IRONPCTSAT 30.1 08/04/2012 0916  Lipid Panel     Component Value Date/Time   CHOL 182 12/23/2023 0922   TRIG 149.0 12/23/2023 0922   HDL 47.70 12/23/2023 0922   CHOLHDL 4 12/23/2023 0922   VLDL 29.8 12/23/2023 0922   LDLCALC 104 (H) 12/23/2023 0922   Hepatic Function Panel     Component Value Date/Time   PROT 7.3 02/03/2024 1009   ALBUMIN 4.2 02/03/2024 1009   AST 22 02/03/2024 1009   ALT 23 02/03/2024 1009   ALKPHOS 76 02/03/2024 1009   BILITOT 0.8 02/03/2024 1009   BILIDIR 0.2 02/17/2008 1005      Component Value Date/Time   TSH 1.280 02/03/2024 1009   Nutritional Lab Results  Component Value Date   VD25OH 30.9 02/03/2024     Assessment and Plan Assessment &  Plan Elevated BP Blood pressure elevated at 173/123, improved to 157/113 on recheck. Previous reading was 138/83. Possible contributing factors include stress from job promotion and loss of Apple watch. This may be an isolated event. - Recheck blood pressure before the end of the visit.  Chronic kidney disease, stage 3a Creatinine at 1.46, GFR improved from 54 to 58. BUN indicates good hydration. No current concerns with kidney function. - Encourage hydration. - Monitor kidney function every three months.  Vitamin D  deficiency Vitamin D  level at 30.9, just above the critical threshold. Goal is to increase level to 50-60 to reduce risk of osteoporosis and improve energy levels. Darker skin pigmentation may contribute to deficiency due to reduced conversion of vitamin D  from sunlight exposure. - Prescribe weekly vitamin D  supplementation 50k international units weekly  Vitamin B12 deficiency B12 level at 368, below the ideal level of 500 but not critically low. B12 deficiency can cause fatigue and is important for red blood cell production. Current diet is B12 rich, but absorption may be an issue. - Prescribe daily B12 supplementation of 1000 mcg.  Obesity Weight loss of 7 pounds in two weeks. Current eating plan is being followed 90% of the time. Adjustments to meal timing and protein intake have improved hunger management. Exercise includes treadmill use twice a week for 20 minutes. Plan to increase exercise to 30 minutes a few times a week. - Continue current eating plan with breakfast adjustments as needed. - Increase exercise to 30 minutes per session. - Provide additional breakfast options to improve adherence.  Prediabetes Fasting glucose normal, but hemoglobin A1c at 5.8 indicates prediabetes. Insulin  level at 24.5 suggests insulin  resistance. Risk of progression to diabetes if not managed. Nutrition is key to management, but medication may assist in improving insulin  sensitivity and  reducing hunger. Metformin  is considered a safe option with minimal side effects, primarily gastrointestinal if taken on an empty stomach. It helps insulin  work more effectively, reducing pancreatic workload and excessive hunger signals. - Prescribe metformin  500 mg daily with food in the AM - Educate on dietary choices to manage insulin  levels.     I have personally spent 55 minutes total time today in preparation, patient care, and documentation for this visit, including the following: review of clinical lab tests; review of medical history, review of   He was informed of the importance of frequent follow up visits to maximize his success with intensive lifestyle modifications for his multiple health conditions.    [name], MD

## 2024-02-17 NOTE — Addendum Note (Signed)
 Addended by: LAFE BAKER CROME on: 02/17/2024 07:32 AM   Modules accepted: Orders

## 2024-03-02 ENCOUNTER — Ambulatory Visit (INDEPENDENT_AMBULATORY_CARE_PROVIDER_SITE_OTHER): Admitting: Family Medicine

## 2024-03-08 ENCOUNTER — Other Ambulatory Visit (INDEPENDENT_AMBULATORY_CARE_PROVIDER_SITE_OTHER): Payer: Self-pay | Admitting: Family Medicine

## 2024-03-08 DIAGNOSIS — E559 Vitamin D deficiency, unspecified: Secondary | ICD-10-CM

## 2024-03-09 ENCOUNTER — Encounter (INDEPENDENT_AMBULATORY_CARE_PROVIDER_SITE_OTHER): Payer: Self-pay | Admitting: Family Medicine

## 2024-03-09 ENCOUNTER — Ambulatory Visit (INDEPENDENT_AMBULATORY_CARE_PROVIDER_SITE_OTHER): Admitting: Family Medicine

## 2024-03-09 VITALS — BP 168/120 | HR 71 | Temp 98.0°F | Ht 70.5 in | Wt 283.0 lb

## 2024-03-09 DIAGNOSIS — Z6841 Body Mass Index (BMI) 40.0 and over, adult: Secondary | ICD-10-CM | POA: Diagnosis not present

## 2024-03-09 DIAGNOSIS — R7303 Prediabetes: Secondary | ICD-10-CM | POA: Diagnosis not present

## 2024-03-09 DIAGNOSIS — E66813 Obesity, class 3: Secondary | ICD-10-CM | POA: Diagnosis not present

## 2024-03-09 DIAGNOSIS — G4733 Obstructive sleep apnea (adult) (pediatric): Secondary | ICD-10-CM | POA: Diagnosis not present

## 2024-03-09 DIAGNOSIS — E559 Vitamin D deficiency, unspecified: Secondary | ICD-10-CM

## 2024-03-09 DIAGNOSIS — I10 Essential (primary) hypertension: Secondary | ICD-10-CM

## 2024-03-09 DIAGNOSIS — E538 Deficiency of other specified B group vitamins: Secondary | ICD-10-CM

## 2024-03-09 MED ORDER — METFORMIN HCL 500 MG PO TABS: 500.0000 mg | ORAL_TABLET | Freq: Every day | ORAL | 0 refills | Status: DC

## 2024-03-09 MED ORDER — VITAMIN D (ERGOCALCIFEROL) 1.25 MG (50000 UNIT) PO CAPS: 50000.0000 [IU] | ORAL_CAPSULE | ORAL | 0 refills | Status: DC

## 2024-03-09 NOTE — Progress Notes (Signed)
 Office: [phone]  /  Fax: [phone]  WEIGHT SUMMARY AND BIOMETRICS  Anthropometric Measurements Height: 5' 10.5 (1.791 m) Weight: 283 lb (128.4 kg) BMI (Calculated): 40.02 Weight at Last Visit: 281 lb Weight Lost Since Last Visit: 0 Weight Gained Since Last Visit: 2 lb Starting Weight: 288 lb Total Weight Loss (lbs): 5 lb (2.268 kg) Peak Weight: 295 lb Waist Measurement : 50 inches   Body Composition  Body Fat %: 33.6 % Fat Mass (lbs): 95.2 lbs Muscle Mass (lbs): 178.8 lbs Total Body Water (lbs): 133 lbs Visceral Fat Rating : 21   Other Clinical Data RMR: 2002 Fasting: no Labs: no Today's Visit #: 3 Starting Date: 02/03/24 Comments: cat 3    Chief Complaint: OBESITY   History of Present Illness Tyrone Freeman is a 53 year old male with obesity who presents for obesity treatment and progress assessment.  He has been following a category three eating plan 90% of the time and exercises at the gym for 30 minutes, three days a week, including treadmill, Jacob's ladder, and free weights. He has gained two pounds in the last month. He experiences no pain or discomfort during physical activities but has noticed the need to loosen up certain areas like shoulders and waist.  His blood pressure was elevated today at 180/119, with a repeat manual reading of 168/120. He does not use his CPAP machine for sleep apnea, which he suspects may be contributing to his elevated blood pressure. He has switched from caffeinated coffee to decaf since his last visit.  He reports improved sleep quality and increased energy levels since following the eating plan, feeling less fatigued and more rested. He no longer experiences daytime drowsiness.  He takes metformin  daily without any side effects, ensuring to eat before taking it. He notes a decrease in nocturnal urination and feels he is urinating less frequently overall.  No current pain or discomfort. No chest pain. No issues  with back, shoulder, or knee pain during physical activity.      PHYSICAL EXAM:  Blood pressure (!) 168/120, pulse 71, temperature 98 F (36.7 C), height 5' 10.5 (1.791 m), weight 283 lb (128.4 kg), SpO2 98%. Body mass index is 40.03 kg/m.  DIAGNOSTIC DATA REVIEWED:  BMET    Component Value Date/Time   NA 138 02/03/2024 1009   K 4.3 02/03/2024 1009   CL 102 02/03/2024 1009   CO2 22 02/03/2024 1009   GLUCOSE 86 02/03/2024 1009   GLUCOSE 104 (H) 12/23/2023 0922   BUN 14 02/03/2024 1009   CREATININE 1.46 (H) 02/03/2024 1009   CALCIUM 9.4 02/03/2024 1009   GFRNONAA 79.37 04/20/2009 1034   GFRAA 73 02/17/2008 1005   Lab Results  Component Value Date   HGBA1C 5.8 (H) 02/03/2024   Lab Results  Component Value Date   INSULIN  24.5 02/03/2024   Lab Results  Component Value Date   TSH 1.280 02/03/2024   CBC    Component Value Date/Time   WBC 6.2 12/23/2023 0922   RBC 5.40 12/23/2023 0922   HGB 15.8 12/23/2023 0922   HCT 45.8 12/23/2023 0922   PLT 229.0 12/23/2023 0922   MCV 84.8 12/23/2023 0922   MCHC 34.5 12/23/2023 0922   RDW 14.0 12/23/2023 0922   Iron Studies    Component Value Date/Time   IRON 104 08/04/2012 0916   IRONPCTSAT 30.1 08/04/2012 0916   Lipid Panel     Component Value Date/Time   CHOL 182 12/23/2023 0922   TRIG  149.0 12/23/2023 0922   HDL 47.70 12/23/2023 0922   CHOLHDL 4 12/23/2023 0922   VLDL 29.8 12/23/2023 0922   LDLCALC 104 (H) 12/23/2023 0922   Hepatic Function Panel     Component Value Date/Time   PROT 7.3 02/03/2024 1009   ALBUMIN 4.2 02/03/2024 1009   AST 22 02/03/2024 1009   ALT 23 02/03/2024 1009   ALKPHOS 76 02/03/2024 1009   BILITOT 0.8 02/03/2024 1009   BILIDIR 0.2 02/17/2008 1005      Component Value Date/Time   TSH 1.280 02/03/2024 1009   Nutritional Lab Results  Component Value Date   VD25OH 30.9 02/03/2024     Assessment and Plan Assessment & Plan Class 3 Obesity Following the category three eating  plan 90% of the time and exercising three days a week for 30-40 minutes. Despite these efforts, he has gained two pounds, likely due to fluid retention rather than true weight gain. Sleep apnea may be contributing to decreased metabolism, affecting weight loss efforts. - Continue category three eating plan - Continue exercise regimen - Address sleep apnea to improve resting metabolic rate  Essential Hypertension Blood pressure is elevated at 180/119, with a manual repeat of 168/120. Previous visit also showed elevated blood pressure, initially thought to be isolated but now considered persistent. Uncontrolled hypertension poses risks of kidney damage, heart attack, and stroke, especially in his 67s. Discussed the importance of monitoring blood pressure at home and the potential need for medication if hypertension persists. - Purchase home blood pressure monitor with extra large cuff - Monitor blood pressure at home and report readings - Discuss potential need for medication if hypertension persists - Will work to be evaluated for OSA treatment as soon as possible. - Continue diet, exercise and weight loss as discussed today as an important part of the treatment plan   Obstructive Sleep Apnea Not using CPAP machine, which may contribute to elevated blood pressure and decreased metabolism. Sleep apnea can lead to congestive heart failure and elevated blood pressure if untreated. Discussed the benefits of auto-titrating CPAP machines and the need for insurance coverage for CPAP. - Refer to sleep study with Dr. Elza - Encourage follow-up with neurologist for sleep apnea management - Consider auto-titrating CPAP machine after evaluation  Prediabetes Currently on metformin  with no side effects. Reports decreased nocturia, suggesting some stabilization of blood sugars. Low dose metformin  is being used, with potential to increase if needed. - Continue metformin  - Refill metformin  prescription -  Consider increasing metformin  dose if necessary  Follow-Up Regular follow-up appointments are scheduled to monitor progress and adjust treatment as needed. - Schedule follow-up appointments on September 29th, October 13th, October 29th, and an additional appointment in November    Savien was informed of the importance of frequent follow up visits to maximize his success with intensive lifestyle modifications for his obesity and obesity related health conditions as recommended by USPSTF and CMS guidelines   [name], MD

## 2024-03-11 ENCOUNTER — Other Ambulatory Visit: Payer: Self-pay | Admitting: Internal Medicine

## 2024-03-13 ENCOUNTER — Encounter: Payer: Self-pay | Admitting: Internal Medicine

## 2024-03-15 ENCOUNTER — Ambulatory Visit (INDEPENDENT_AMBULATORY_CARE_PROVIDER_SITE_OTHER): Admitting: Family Medicine

## 2024-03-27 ENCOUNTER — Encounter (INDEPENDENT_AMBULATORY_CARE_PROVIDER_SITE_OTHER): Payer: Self-pay | Admitting: Family Medicine

## 2024-03-28 ENCOUNTER — Telehealth (INDEPENDENT_AMBULATORY_CARE_PROVIDER_SITE_OTHER): Admitting: Family Medicine

## 2024-03-28 ENCOUNTER — Encounter (INDEPENDENT_AMBULATORY_CARE_PROVIDER_SITE_OTHER): Payer: Self-pay

## 2024-04-02 ENCOUNTER — Other Ambulatory Visit (INDEPENDENT_AMBULATORY_CARE_PROVIDER_SITE_OTHER): Payer: Self-pay | Admitting: Family Medicine

## 2024-04-02 DIAGNOSIS — E559 Vitamin D deficiency, unspecified: Secondary | ICD-10-CM

## 2024-04-09 ENCOUNTER — Other Ambulatory Visit (INDEPENDENT_AMBULATORY_CARE_PROVIDER_SITE_OTHER): Payer: Self-pay | Admitting: Family Medicine

## 2024-04-09 DIAGNOSIS — R7303 Prediabetes: Secondary | ICD-10-CM

## 2024-04-11 ENCOUNTER — Encounter: Payer: Self-pay | Admitting: Neurology

## 2024-04-11 ENCOUNTER — Ambulatory Visit (INDEPENDENT_AMBULATORY_CARE_PROVIDER_SITE_OTHER): Admitting: Family Medicine

## 2024-04-11 ENCOUNTER — Ambulatory Visit: Admitting: Neurology

## 2024-04-11 ENCOUNTER — Encounter (INDEPENDENT_AMBULATORY_CARE_PROVIDER_SITE_OTHER): Payer: Self-pay | Admitting: Family Medicine

## 2024-04-11 VITALS — BP 152/90 | HR 85 | Temp 98.3°F | Ht 70.5 in | Wt 277.0 lb

## 2024-04-11 VITALS — BP 161/107 | HR 74 | Ht 71.0 in | Wt 282.4 lb

## 2024-04-11 DIAGNOSIS — J310 Chronic rhinitis: Secondary | ICD-10-CM

## 2024-04-11 DIAGNOSIS — Z6841 Body Mass Index (BMI) 40.0 and over, adult: Secondary | ICD-10-CM

## 2024-04-11 DIAGNOSIS — E559 Vitamin D deficiency, unspecified: Secondary | ICD-10-CM | POA: Diagnosis not present

## 2024-04-11 DIAGNOSIS — I10 Essential (primary) hypertension: Secondary | ICD-10-CM | POA: Diagnosis not present

## 2024-04-11 DIAGNOSIS — G4733 Obstructive sleep apnea (adult) (pediatric): Secondary | ICD-10-CM | POA: Insufficient documentation

## 2024-04-11 DIAGNOSIS — E6609 Other obesity due to excess calories: Secondary | ICD-10-CM | POA: Insufficient documentation

## 2024-04-11 DIAGNOSIS — R7303 Prediabetes: Secondary | ICD-10-CM | POA: Diagnosis not present

## 2024-04-11 DIAGNOSIS — R0683 Snoring: Secondary | ICD-10-CM | POA: Diagnosis not present

## 2024-04-11 DIAGNOSIS — Z6839 Body mass index (BMI) 39.0-39.9, adult: Secondary | ICD-10-CM

## 2024-04-11 DIAGNOSIS — E66813 Obesity, class 3: Secondary | ICD-10-CM | POA: Diagnosis not present

## 2024-04-11 DIAGNOSIS — E66812 Obesity, class 2: Secondary | ICD-10-CM

## 2024-04-11 DIAGNOSIS — M2629 Other anomalies of dental arch relationship: Secondary | ICD-10-CM

## 2024-04-11 MED ORDER — VITAMIN D (ERGOCALCIFEROL) 1.25 MG (50000 UNIT) PO CAPS: 50000.0000 [IU] | ORAL_CAPSULE | ORAL | 0 refills | Status: DC

## 2024-04-11 MED ORDER — MOMETASONE FUROATE 50 MCG/ACT NA SUSP: 2.0000 | Freq: Every day | NASAL | 12 refills | Status: AC

## 2024-04-11 MED ORDER — METFORMIN HCL 500 MG PO TABS: 500.0000 mg | ORAL_TABLET | Freq: Every day | ORAL | 0 refills | Status: DC

## 2024-04-11 MED ORDER — LISINOPRIL 10 MG PO TABS: 10.0000 mg | ORAL_TABLET | Freq: Every day | ORAL | 0 refills | Status: DC

## 2024-04-11 MED ORDER — LISINOPRIL 10 MG PO TABS: 10.0000 mg | ORAL_TABLET | Freq: Every day | ORAL | 3 refills | Status: DC

## 2024-04-11 NOTE — Patient Instructions (Signed)
 Quality Sleep Information, Adult Quality sleep is important for your mental and physical health. It also improves your quality of life. Quality sleep means you: Are asleep for most of the time you are in bed. Fall asleep within 30 minutes. Wake up no more than once a night. Are awake for no longer than 20 minutes if you do wake up during the night. Most adults need 7-8 hours of quality sleep each night. How can poor sleep affect me? If you do not get enough quality sleep, you may have: Mood swings. Daytime sleepiness. Decreased alertness, reaction time, and concentration. Sleep disorders, such as insomnia and sleep apnea. Difficulty with: Solving problems. Coping with stress. Paying attention. These issues may affect your performance and productivity at work, school, and home. Lack of sleep may also put you at higher risk for accidents, suicide, and risky behaviors. If you do not get quality sleep, you may also be at higher risk for several health problems, including: Infections. Type 2 diabetes. Heart disease. High blood pressure. Obesity. Worsening of long-term conditions, like arthritis, kidney disease, depression, Parkinson's disease, and epilepsy. What actions can I take to get more quality sleep? Sleep schedule and routine Stick to a sleep schedule. Go to sleep and wake up at about the same time each day. Do not try to sleep less on weekdays and make up for lost sleep on weekends. This does not work. Limit naps during the day to 30 minutes or less. Do not take naps in the late afternoon. Make time to relax before bed. Reading, listening to music, or taking a hot bath promotes quality sleep. Make your bedroom a place that promotes quality sleep. Keep your bedroom dark, quiet, and at a comfortable room temperature. Make sure your bed is comfortable. Avoid using electronic devices that give off bright blue light for 30 minutes before bedtime. Your brain perceives bright blue light  as sunlight. This includes television, phones, and computers. If you are lying awake in bed for longer than 20 minutes, get up and do a relaxing activity until you feel sleepy. Lifestyle     Try to get at least 30 minutes of exercise on most days. Do not exercise 2-3 hours before going to bed. Do not use any products that contain nicotine or tobacco. These products include cigarettes, chewing tobacco, and vaping devices, such as e-cigarettes. If you need help quitting, ask your health care provider. Do not drink caffeinated beverages for at least 8 hours before going to bed. Coffee, tea, and some sodas contain caffeine. Do not drink alcohol or eat large meals close to bedtime. Try to get at least 30 minutes of sunlight every day. Morning sunlight is best. Medical concerns Work with your health care provider to treat medical conditions that may affect sleeping, such as: Nasal obstruction. Snoring. Sleep apnea and other sleep disorders. Talk to your health care provider if you think any of your prescription medicines may cause you to have difficulty falling or staying asleep. If you have sleep problems, talk with a sleep consultant. If you think you have a sleep disorder, talk with your health care provider about getting evaluated by a specialist. Where to find more information Sleep Foundation: sleepfoundation.org American Academy of Sleep Medicine: aasm.org Centers for Disease Control and Prevention (CDC): TonerPromos.no Contact a health care provider if: You have trouble getting to sleep or staying asleep. You often wake up very early in the morning and cannot get back to sleep. You have daytime sleepiness. You  have daytime sleep attacks of suddenly falling asleep and sudden muscle weakness (narcolepsy). You have a tingling sensation in your legs with a strong urge to move your legs (restless legs syndrome). You stop breathing briefly during sleep (sleep apnea). You think you have a sleep  disorder or are taking a medicine that is affecting your quality of sleep. Summary Most adults need 7-8 hours of quality sleep each night. Getting enough quality sleep is important for your mental and physical health. Make your bedroom a place that promotes quality sleep, and avoid things that may cause you to have poor sleep, such as alcohol, caffeine, smoking, or large meals. Talk to your health care provider if you have trouble falling asleep or staying asleep. This information is not intended to replace advice given to you by your health care provider. Make sure you discuss any questions you have with your health care provider. Document Revised: 10/09/2021 Document Reviewed: 10/09/2021 Elsevier Patient Education  2024 [employer]  Living With Sleep Apnea Sleep apnea is a condition that affects your breathing while you're sleeping. Your tongue or the tissue in your throat may block the flow of air while you sleep. You may have shallow breathing or stop breathing for short periods of time. The breaks in breathing interrupt the deep sleep that you need to feel rested. Even if you don't wake up from the gaps in breathing, you may feel tired during the day. People with sleep apnea may snore loudly. You may have a headache in the morning and feel anxious or depressed. How can sleep apnea affect me? Sleep apnea increases your chances of being very tired during the day. This is called daytime fatigue. Sleep apnea can also increase your risk of: Heart attack. Stroke. Obesity. Type 2 diabetes. Heart failure. Irregular heartbeat. High blood pressure. If you are very tired during the day, you may be more likely to: Not do well in school or at work. Fall asleep while driving. Have trouble paying attention. Develop depression or anxiety. Have problems having sex. This is called sexual dysfunction. What actions can I take to manage sleep apnea? Sleep apnea treatment  If you were given a  device to open your airway while you sleep, use it only as told by your health care provider. You may be given: An oral appliance. This is a mouthpiece that shifts your lower jaw forward. A continuous positive airway pressure (CPAP) device. This blows air through a mask. A nasal expiratory positive airway pressure (EPAP) device. This has valves that you put into each nostril. A bi-level positive airway pressure (BIPAP) device. This blows air through a mask when you breathe in and breathe out. You may need surgery if other treatments don't work for you. Sleep habits Go to sleep and wake up at the same time every day. This helps set your internal clock for sleeping. If you stay up later than usual on weekends, try to get up in the morning within 2 hours of the time you usually wake up. Try to get at least 7-9 hours of sleep each night. Stop using a computer, tablet, and mobile phone a few hours before bedtime. Do not take long naps during the day. If you nap, limit it to 30 minutes. Have a relaxing bedtime routine. Reading or listening to music may relax you and help you sleep. Use your bedroom only for sleep. Keep your television and computer out of your bedroom. Keep your bedroom cool, dark, and quiet. Use a supportive  mattress and pillows. Follow your provider's instructions for other changes to sleep habits. Nutrition Do not eat big meals in the evening. Do not have caffeine in the later part of the day. The effects of caffeine can last for more than 5 hours. Follow your provider's instructions for any changes to what you eat and drink. Lifestyle Do not drink alcohol before bedtime. Alcohol can cause you to fall asleep at first, but then it can cause you to wake up in the middle of the night and have trouble getting back to sleep. Do not smoke, vape, or use nicotine or tobacco. Medicines Take over-the-counter and prescription medicines only as told by your provider. Do not use  over-the-counter sleep medicine. You may become dependent on this medicine, and it can make sleep apnea worse. Do not take medicines, such as sedatives and narcotics, unless told to by your provider. Activity Exercise on most days, but avoid exercising in the evening. Exercising near bedtime can interfere with sleeping. If possible, spend time outside every day. Natural light helps with your internal clock. General information Lose weight if you need to. Stay at a healthy weight. If you are having surgery, make sure to tell your provider that you have sleep apnea. You may need to bring your device with you. Keep all follow-up visits. Your provider will want to check on your condition. Where to find more information National Heart, Lung, and Blood Institute: BuffaloDryCleaner.gl This information is not intended to replace advice given to you by your health care provider. Make sure you discuss any questions you have with your health care provider. Document Revised: 10/08/2022 Document Reviewed: 10/08/2022 Elsevier Patient Education  2024 [employer].

## 2024-04-11 NOTE — Progress Notes (Signed)
 @[facility]   Provider:  Dedra Gores, MD    Primary Care Physician:  Amon Aloysius BRAVO, MD [address] [place] [address]    Referring Provider: Verdon Louann BIRCH, Md [address] [place],  [place] [address]        Chief Concern for this Consultation:   Patient presents with          HPI: I have the pleasure of meeting with here for new sleep consult 04/11/2024 for  a new evaluation of apnea-  he developed sinusitis, and bronchitis on CPAP.  He tried CPAP over a couple of months then quit frustrated.  He suffered an ankle injury and became less active and gained weight over the last 3.5 years.  Surgery March 2021. Current BMI  just below 40. He developed increasing BP, and he just started medication.   Chief concern according to patient:    Medical history of  has a past medical history of Allergy, Esophageal reflux, Gastric ulcer, Heartburn, Hypogonadism male, OSA (obstructive sleep apnea), Seasonal allergies (05/23/2012), and Sleep apnea.    Sleep relevant medical/ surgical and symptom history:    ENT surgery or problems: (Sinusitis) , Obesity.' This patient had a previous sleep study/ studies in 11/ 2020  with moderate severe OSA . 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 He  lives in a private home, in a household with spouse and one dog  pets, Daughter has moved back home  23, Nicotine use: /.  ETOH use: /,  Caffeine intake in form of: Coffee (barely ), Soft drinks (/), Tea ( /) or Energy drinks ( including those containing  taurine ). Caffeine is last consumed at AM .  Exercises regularly in form of 3 days a week 30-40 minutes.  Volunteering, (  active member of a club, church or other community memberships and engagements).      Sleep habits  and routines are as follows: The patient's dinner time is around 6-7 PM.  The patient goes to bed at, or close to, 11-12 PM. The bedroom is shared with spouse  and is described as cool, quiet, and dark.  Spouse has observed apneas and snoring.  The patient reports that it takes < 10 minutes to fall asleep, then continues to sleep for 7 hours, uninterrupted or woken up by the dog, the need to void (Nocturia).   The preferred position is  side sleep ,  with support of 1-2 pillows, ( adjustable bed/ wedge ).  Mouth guard helped too. Mandibular device.  The total estimated sleep time is circa 7 hours.  Dreams are reportedly rare/ frequent/ and can be vivid.  Dream enactment has not been reported.   6. 15  AM is the usual week- day rise time. The patient wakes up spontaneously/  He reports mostly/ feeling refreshed and restored in the morning, waking with symptoms such as dry mouth, no morning headaches, no stiffness or pain, and fatigue.  No sleep paralysis has been experienced.  Naps in daytime are taken rarely / infrequently (there is a desire to nap and not much opportunity), lasting from 15 to 30 and have a refreshing quality.  These do not interfere with nocturnal sleep.    Review of Systems: Out  of a complete 14 system review, the patient complains of only the following symptoms, and all other reviewed systems are negative.:  Hypersomnia     How likely are you to doze in the following situations: 0 = not likely, 1 = slight chance, 2 = moderate chance, 3 = high chance Sitting and Reading? Watching Television? Sitting inactive in a public place (theater or meeting)? As a passenger in a car for an hour without a break? Lying down in the afternoon when circumstances permit? Sitting and talking to someone? Sitting quietly after lunch without alcohol? In a car, while stopped for a few minutes in traffic?   Total ESS =6 / 24 points.    FSS endorsed at 19/ 63 points.  GDS:  NA Social  History   Socioeconomic History   Marital status: Married    Spouse name: Not on file   Number of children: 1   Years of education: Not on file   Highest education level: Doctorate  Occupational History   Occupation: Contractor, [employer]  Tobacco Use   Smoking status: Never   Smokeless tobacco: Never  Vaping Use   Vaping status: Never Used  Substance and Sexual Activity   Alcohol use: Not Currently    Alcohol/week: 1.0 standard drink of alcohol    Types: 1 Shots of liquor per week    Comment: rarely    Drug use: No   Sexual activity: Not on file  Other Topics Concern   Not on file  Social History Narrative   Household-- pt, wife , daughter  2002, graduated from college , lives w/ them      Right handed   Caffeine: no coffee    Social Drivers of Corporate investment banker Strain: Low Risk  (12/22/2023)   Overall Financial Resource Strain (CARDIA)    Difficulty of Paying Living Expenses: Not hard at all  Food Insecurity: No Food Insecurity (12/22/2023)   Hunger Vital Sign    Worried About Running Out of Food in the Last Year: Never true    Ran Out of Food in the Last Year: Never true  Transportation Needs: No Transportation Needs (12/22/2023)   PRAPARE - Administrator, Civil Service (Medical): No    Lack of Transportation (Non-Medical): No  Physical Activity: Insufficiently Active (12/22/2023)   Exercise Vital Sign    Days of Exercise per Week: 2 days    Minutes of Exercise per Session: 40 min  Stress: No Stress Concern Present (12/22/2023)   [employer] of Occupational Health - Occupational Stress Questionnaire    Feeling of Stress: Only a little  Social Connections: Unknown (12/22/2023)   Social Connection and Isolation Panel    Frequency of Communication with Friends and Family: More than three times a week    Frequency of Social Gatherings with Friends and Family: Twice a week    Attends Religious Services: More than 4 times per year     Active Member of [employer] or Organizations: Yes    Attends Engineer, structural: More than 4 times per year    Marital Status: Not on file    Family History  Problem Relation Age of Onset   Colon polyps Mother    Prostate cancer Maternal Grandfather 73   CAD Neg Hx    Diabetes Neg Hx    Colon cancer Neg Hx    Esophageal cancer Neg Hx    Rectal cancer Neg Hx    Stomach cancer  Neg Hx    Sleep apnea Neg Hx        not that he is aware of     Past Medical History:  Diagnosis Date   Allergy    Esophageal reflux    Gastric ulcer    Heartburn    Hypogonadism male    OSA (obstructive sleep apnea)    Seasonal allergies 05/23/2012   Sleep apnea    does not wear cpap    Past Surgical History:  Procedure Laterality Date   ORIF ANKLE FRACTURE Left 09/08/2020   UPPER GASTROINTESTINAL ENDOSCOPY     WISDOM TOOTH EXTRACTION       Current Outpatient Medications on File Prior to Visit  Medication Sig Dispense Refill   cyanocobalamin (VITAMIN B12) 1000 MCG tablet Take 1 tablet (1,000 mcg total) by mouth daily. 30 tablet 0   lisinopril (ZESTRIL) 10 MG tablet Take 1 tablet (10 mg total) by mouth daily. 30 tablet 0   metFORMIN  (GLUCOPHAGE ) 500 MG tablet Take 1 tablet (500 mg total) by mouth daily with breakfast. 30 tablet 0   pantoprazole  (PROTONIX ) 40 MG tablet Take 1 tablet (40 mg total) by mouth daily. 90 tablet 1   sildenafil  (REVATIO ) 20 MG tablet TAKE 3-4 TABLETS (60-80 MG TOTAL) BY MOUTH DAILY AS NEEDED. 30 tablet 5   Vitamin D , Ergocalciferol , (DRISDOL ) 1.25 MG (50000 UNIT) CAPS capsule Take 1 capsule (50,000 Units total) by mouth every 7 (seven) days. 4 capsule 0   No current facility-administered medications on file prior to visit.    Allergies  Allergen Reactions   Hydrocodone     Nausea, no rash or itching    Vitals:   04/11/24 1100  BP: (!) 161/107  Pulse: 74  SpO2: 98%     Physical exam:   General: The patient was alert and appears not in acute  distress.  Mood and affect are appropriate .  The patient's interactions are: Cooperative, makes eye contact, follows the instructions and answers questions coherently.  The patient is groomed and appropriately groomed and dressed. Head: Normocephalic, atraumatic.  Neck is supple. Mallampati: 3.  The neck circumference measured 18 inches. Nasal airflow was patent ,   Overbite / Retrognathia was noted.  Dental status:  biological  Cardiovascular:  Regular rate and cardiac rhythm by palpable pulse. Respiratory: no audible wheezing, no tachypnoea.   Skin:  Without evidence of ankle edema. No discoloration.  Trunk:  BMI is 39.4   The patient's posture was erect.   Neurologic exam : The patient was awake and alert, oriented to place and time.   Attention span & concentration ability appeared normal.  Speech was fluent, without dysarthria, dysphonia or aphasia, and of normal volume.     Cranial nerves:  There was no loss of smell or taste reported  Pupils are round, equal in size and briskly reactive to light.  Funduscopic exam was deferred.  Extraocular movements in vertical and horizontal planes were intact and without nystagmus. (No Diplopia reported). Visual fields by finger perimetry are intact. Hearing was intact to soft voice.    Facial sensation intact to fine touch.  Facial motor strength: Symmetric movement and tongue and uvula move midline.  Neck ROM: rotation, tilt and flexion extension were intact for age and shoulder shrug was symmetrical.    Motor exam:  Symmetric bulk, strength and ROM.   Normal tone without cog- wheeling, and symmetric grip strength.   Sensory:  Fine touch and vibration were tested by tuning  fork and intact.     Deep tendon reflexes: Upper extremities did show symmetric DTRs. Lower extremity DTRs were symmetric and attenuated.     I would like to thank Amon Aloysius BRAVO, MD and Verdon Parry D, Md [address] [place],  [place] [address] for  allowing me to meet with Mr Kushnir.   In short, he is presenting with Risk factors for OSA were present,  including : Body mass index is 39.39 kg/m., larger 18 neck size and upper airway anatomy.  Snoring and apnea were witnessed.   OSA had been dx in 2020 and BMI has increased.  N asal congestion is seasonal.     My Plan is to proceed with:  PSG/  SPLIT- or HST :   1) I prefer an in lab titration for this patient with a previous dx of sleep apnea, split at [facility] 20/h.  If not possible, will go for HST.     I plan to follow up personally  or through our NP within 4 months.   A total time of  35  minutes consistent of a part of face to face encounter , exam and interview,  and additional preparation time for chart review was spent .  At today's visit, we discussed treatment options, associated risk and benefits, and engage in counseling as needed including, but not limited to:  Sleep hygiene, Quality Sleep Habits, and Safety concerns for patients with daytime sleepiness who are warned to not operate machinery/ motor vehicles when drowsy. Risk factors for sleep apnea were identified:  Body mass index is 39.39 kg/m..  Additionally, the following were reviewed: Past medical records, past medical and surgical history, family and social background, as well as relevant laboratory results, imaging findings, and medical notes, where applicable.  This note was generated by myself in part by using dictation software, and as a result, it may contain unintentional typos and errors.  Nevertheless, effort was made to accurately convey the pertinent aspects of the patient's visit.   Dedra Gores, MD   [facility] Sleep Board certified in Sleep Medicine by The [employer] of Sleep Medicine and Diplomate of the [employer] of Sleep Medicine (AASM) . Board certified In Neurology, Diplomat of the ABPN,  Fellow of the [employer] of Neurology.

## 2024-04-11 NOTE — Progress Notes (Signed)
 Office: [phone]  /  Fax: [phone]  WEIGHT SUMMARY AND BIOMETRICS  Anthropometric Measurements Height: 5' 10.5 (1.791 m) Weight: 277 lb (125.6 kg) BMI (Calculated): 39.17 Weight at Last Visit: 283lb Weight Lost Since Last Visit: 6lb Weight Gained Since Last Visit: 0lb Starting Weight: 288lb Total Weight Loss (lbs): 11 lb (4.99 kg) Peak Weight: 295lb Waist Measurement : 50 inches   Body Composition  Body Fat %: 32.2 % Fat Mass (lbs): 89.4 lbs Muscle Mass (lbs): 179.2 lbs Total Body Water (lbs): 128.8 lbs Visceral Fat Rating : 19   Other Clinical Data RMR: 2002 Fasting: No Labs: No Today's Visit #: 51 Starting Date: 02/03/24 Comments: cat 3    Chief Complaint: OBESITY     History of Present Illness Tyrone Freeman is a 53 year old male with obesity and prediabetes who presents for obesity treatment and progress assessment.  He adheres to a category three eating plan approximately 90% of the time, focusing on increasing fruits, vegetables, meeting protein goals, and maintaining adequate hydration. He does not skip meals and aims for at least seven hours of sleep most nights. He exercises for 20 to 30 minutes three times a week, primarily using the treadmill and light weights. Since his last visit a month ago, he has lost six pounds.  He manages his prediabetes with metformin , diet, and exercise. He has no issues with his medications and is unsure about continuing vitamin D  supplementation as his prescription has ended. His last vitamin D  level was 30.9 ng/mL in August. He is currently taking prescription ergocalciferol  50,000 IU weekly for a vitamin D  deficiency and requests a refill.  He reports high blood pressure despite exercising and following dietary recommendations. He is visiting a sleep clinic for further evaluation. No headaches, shortness of breath, or side effects during exercise.      PHYSICAL EXAM:  Blood pressure (!) 152/90, pulse 85,  temperature 98.3 F (36.8 C), height 5' 10.5 (1.791 m), weight 277 lb (125.6 kg), SpO2 98%. Body mass index is 39.18 kg/m.  DIAGNOSTIC DATA REVIEWED:  BMET    Component Value Date/Time   NA 138 02/03/2024 1009   K 4.3 02/03/2024 1009   CL 102 02/03/2024 1009   CO2 22 02/03/2024 1009   GLUCOSE 86 02/03/2024 1009   GLUCOSE 104 (H) 12/23/2023 0922   BUN 14 02/03/2024 1009   CREATININE 1.46 (H) 02/03/2024 1009   CALCIUM 9.4 02/03/2024 1009   GFRNONAA 79.37 04/20/2009 1034   GFRAA 73 02/17/2008 1005   Lab Results  Component Value Date   HGBA1C 5.8 (H) 02/03/2024   Lab Results  Component Value Date   INSULIN  24.5 02/03/2024   Lab Results  Component Value Date   TSH 1.280 02/03/2024   CBC    Component Value Date/Time   WBC 6.2 12/23/2023 0922   RBC 5.40 12/23/2023 0922   HGB 15.8 12/23/2023 0922   HCT 45.8 12/23/2023 0922   PLT 229.0 12/23/2023 0922   MCV 84.8 12/23/2023 0922   MCHC 34.5 12/23/2023 0922   RDW 14.0 12/23/2023 0922   Iron Studies    Component Value Date/Time   IRON 104 08/04/2012 0916   IRONPCTSAT 30.1 08/04/2012 0916   Lipid Panel     Component Value Date/Time   CHOL 182 12/23/2023 0922   TRIG 149.0 12/23/2023 0922   HDL 47.70 12/23/2023 0922   CHOLHDL 4 12/23/2023 0922   VLDL 29.8 12/23/2023 0922   LDLCALC 104 (H) 12/23/2023 9077  Hepatic Function Panel     Component Value Date/Time   PROT 7.3 02/03/2024 1009   ALBUMIN 4.2 02/03/2024 1009   AST 22 02/03/2024 1009   ALT 23 02/03/2024 1009   ALKPHOS 76 02/03/2024 1009   BILITOT 0.8 02/03/2024 1009   BILIDIR 0.2 02/17/2008 1005      Component Value Date/Time   TSH 1.280 02/03/2024 1009   Nutritional Lab Results  Component Value Date   VD25OH 30.9 02/03/2024     Assessment and Plan Assessment & Plan Class 3 Obesity Class 3 obesity with ongoing management through a category three eating plan, exercise, and lifestyle modifications. He adheres to the eating plan about 90% of  the time, incorporating more fruits, vegetables, and protein, and maintaining hydration. He exercises 20-30 minutes three times a week, focusing on treadmill and light weights. He has lost six pounds since the last visit, indicating progress in weight management. - Continue category three eating plan. - Encourage incorporation of fruits, vegetables, and protein. - Maintain exercise routine with treadmill and light weights. - Consider increasing treadmill incline to enhance lower body strengthening. - Reinforce the use of 300 discretionary calories wisely.  Prediabetes Prediabetes managed with metformin , diet, and exercise. No reported issues with metformin . Continued adherence to lifestyle modifications is crucial for glycemic control. - Refill metformin  prescription. - Continue diet and exercise regimen.  Vitamin D  Deficiency Vitamin D  deficiency previously treated with ergocalciferol  50,000 IU weekly. Last vitamin D  level was 30.9 ng/mL in August. Plan to continue supplementation for a full three months before re-evaluation. - Refill ergocalciferol  50,000 IU weekly for three months. - Re-evaluate vitamin D  levels after three months.  Essential Hypertension Essential hypertension with recent improvement in blood pressure readings. However, blood pressure remains elevated, posing a risk to his health. Initiation of lisinopril is planned to manage blood pressure and protect renal function. Discussed potential side effects of lisinopril, including a rare cough and hypotension, and the benefits of kidney protection. Lisinopril is inexpensive and generally well-tolerated. - Initiate lisinopril 10 mg once daily. - Monitor for side effects such as tickly cough or hypotension. - Advise on maintaining hydration to prevent hypotension.  Follow-Up Follow-up appointments are scheduled to monitor progress and adjust treatment plans as necessary. - Confirm follow-up appointments on October 29th and  November 12th.    Tyrone Freeman was informed of the importance of frequent follow up visits to maximize his success with intensive lifestyle modifications for his obesity and obesity related health conditions as recommended by USPSTF and CMS guidelines   Tyrone Penton, MD

## 2024-04-27 ENCOUNTER — Encounter (INDEPENDENT_AMBULATORY_CARE_PROVIDER_SITE_OTHER): Payer: Self-pay | Admitting: Family Medicine

## 2024-04-27 ENCOUNTER — Ambulatory Visit (INDEPENDENT_AMBULATORY_CARE_PROVIDER_SITE_OTHER): Admitting: Family Medicine

## 2024-04-27 VITALS — BP 156/113 | HR 82 | Temp 97.9°F | Ht 70.5 in | Wt 279.0 lb

## 2024-04-27 DIAGNOSIS — I10 Essential (primary) hypertension: Secondary | ICD-10-CM | POA: Diagnosis not present

## 2024-04-27 DIAGNOSIS — R7303 Prediabetes: Secondary | ICD-10-CM

## 2024-04-27 DIAGNOSIS — E559 Vitamin D deficiency, unspecified: Secondary | ICD-10-CM | POA: Diagnosis not present

## 2024-04-27 DIAGNOSIS — E66813 Obesity, class 3: Secondary | ICD-10-CM | POA: Diagnosis not present

## 2024-04-27 DIAGNOSIS — Z6839 Body mass index (BMI) 39.0-39.9, adult: Secondary | ICD-10-CM

## 2024-04-27 MED ORDER — METFORMIN HCL 500 MG PO TABS: 500.0000 mg | ORAL_TABLET | Freq: Every day | ORAL | 0 refills | Status: DC

## 2024-04-27 MED ORDER — LISINOPRIL 20 MG PO TABS: 20.0000 mg | ORAL_TABLET | Freq: Every day | ORAL | 0 refills | Status: DC

## 2024-04-27 MED ORDER — VITAMIN D (ERGOCALCIFEROL) 1.25 MG (50000 UNIT) PO CAPS: 50000.0000 [IU] | ORAL_CAPSULE | ORAL | 0 refills | Status: DC

## 2024-04-27 NOTE — Progress Notes (Signed)
 Office: [phone]  /  Fax: [phone]  WEIGHT SUMMARY AND BIOMETRICS  Anthropometric Measurements Height: 5' 10.5 (1.791 m) Weight: 279 lb (126.6 kg) BMI (Calculated): 39.45 Weight at Last Visit: 277 lb Weight Lost Since Last Visit: 0 Weight Gained Since Last Visit: 2 lb Starting Weight: 288 lb Total Weight Loss (lbs): 9 lb (4.082 kg) Peak Weight: 295 lb Waist Measurement : 50 inches   Body Composition  Body Fat %: 32.7 % Fat Mass (lbs): 91.2 lbs Muscle Mass (lbs): 178.6 lbs Total Body Water (lbs): 129 lbs Visceral Fat Rating : 20   Other Clinical Data RMR: 2002 Fasting: no Labs: no Starting Date: 02/03/24    Chief Complaint: OBESITY    History of Present Illness Tyrone Freeman is a 53 year old male who presents for obesity treatment and progress assessment.  He has been following the category three eating plan approximately 90% of the time. Despite this, he has not engaged in any exercise due to personal commitments, including assisting his mother and traveling out of town. He has experienced a weight gain of two pounds over the past two weeks.  He is currently being treated for prediabetes with metformin , hyperlipidemia with lisinopril, and vitamin D  deficiency with ergocalciferol  50,000 IU weekly. He requests refills for these medications.  The lowest blood pressure reading he has seen since starting medication was 124/86, with an average of 131-134/90. This morning, his blood pressure was 130/95.  He has been experiencing a busy work schedule and personal commitments, which have impacted his ability to exercise. He mentions trying to maintain a healthy diet while traveling, although he acknowledges the challenges of eating out.      PHYSICAL EXAM:  Blood pressure (!) 156/113, pulse 82, temperature 97.9 F (36.6 C), height 5' 10.5 (1.791 m), weight 279 lb (126.6 kg), SpO2 97%. Body mass index is 39.47 kg/m.  DIAGNOSTIC DATA REVIEWED:  BMET     Component Value Date/Time   NA 138 02/03/2024 1009   K 4.3 02/03/2024 1009   CL 102 02/03/2024 1009   CO2 22 02/03/2024 1009   GLUCOSE 86 02/03/2024 1009   GLUCOSE 104 (H) 12/23/2023 0922   BUN 14 02/03/2024 1009   CREATININE 1.46 (H) 02/03/2024 1009   CALCIUM 9.4 02/03/2024 1009   GFRNONAA 79.37 04/20/2009 1034   GFRAA 73 02/17/2008 1005   Lab Results  Component Value Date   HGBA1C 5.8 (H) 02/03/2024   Lab Results  Component Value Date   INSULIN  24.5 02/03/2024   Lab Results  Component Value Date   TSH 1.280 02/03/2024   CBC    Component Value Date/Time   WBC 6.2 12/23/2023 0922   RBC 5.40 12/23/2023 0922   HGB 15.8 12/23/2023 0922   HCT 45.8 12/23/2023 0922   PLT 229.0 12/23/2023 0922   MCV 84.8 12/23/2023 0922   MCHC 34.5 12/23/2023 0922   RDW 14.0 12/23/2023 0922   Iron Studies    Component Value Date/Time   IRON 104 08/04/2012 0916   IRONPCTSAT 30.1 08/04/2012 0916   Lipid Panel     Component Value Date/Time   CHOL 182 12/23/2023 0922   TRIG 149.0 12/23/2023 0922   HDL 47.70 12/23/2023 0922   CHOLHDL 4 12/23/2023 0922   VLDL 29.8 12/23/2023 0922   LDLCALC 104 (H) 12/23/2023 0922   Hepatic Function Panel     Component Value Date/Time   PROT 7.3 02/03/2024 1009   ALBUMIN 4.2 02/03/2024 1009   AST 22 02/03/2024  1009   ALT 23 02/03/2024 1009   ALKPHOS 76 02/03/2024 1009   BILITOT 0.8 02/03/2024 1009   BILIDIR 0.2 02/17/2008 1005      Component Value Date/Time   TSH 1.280 02/03/2024 1009   Nutritional Lab Results  Component Value Date   VD25OH 30.9 02/03/2024     Assessment and Plan Assessment & Plan Class 3 Obesity Class 3 obesity with recent weight gain of 2 pounds over the last two weeks, attributed to fluid retention and increased caloric intake from eating out while traveling. He follows the category three eating plan 90% of the time but has not exercised due to personal commitments. - Implement a low carbohydrate meal plan with  specific food list and restrictions to enhance weight loss and manage fluid retention and blood pressure. - Encourage adherence to eating plan and exercise as able. - Reassess weight and dietary adherence in two weeks.  Prediabetes Prediabetes is being managed with metformin . - Continue metformin  as prescribed. - Continue diet, exercise and weight loss as discussed today as an important part of the treatment plan   Essential Hypertension Essential hypertension with current blood pressure readings of 158/93 and 156/113. Home readings average 131-134/90, with a recent reading of 130/95. The current lisinopril dose is 10 mg, which is considered low. - Increase lisinopril to 20 mg daily. - Monitor blood pressure at home and report readings. - Reassess blood pressure control in two weeks. - Consider adding amlodipine if blood pressure remains uncontrolled, discussing potential side effects such as ankle swelling. - Continue diet, exercise and weight loss as discussed today as an important part of the treatment plan  Vitamin D  Deficiency Vitamin D  deficiency is being managed with ergocalciferol  50,000 IU weekly. - Continue ergocalciferol  50,000 IU weekly.   Tyrone Freeman was counseled on the importance of maintaining healthy lifestyle habits, including balanced nutrition, regular physical activity, and behavioral modifications, while taking antiobesity medication.  Patient verbalized understanding that medication is an adjunct to, not a replacement for, lifestyle changes and that the long-term success and weight maintenance depend on continued adherence to these strategies.   Tyrone Freeman was informed of the importance of frequent follow up visits to maximize his success with intensive lifestyle modifications for his obesity and obesity related health conditions as recommended by USPSTF and CMS guidelines   [name], MD

## 2024-05-04 ENCOUNTER — Telehealth: Payer: Self-pay | Admitting: Neurology

## 2024-05-04 NOTE — Telephone Encounter (Signed)
 05/04/24 LVM [place] 04/11/24 [employer] state no auth req EE

## 2024-05-11 ENCOUNTER — Encounter (INDEPENDENT_AMBULATORY_CARE_PROVIDER_SITE_OTHER): Payer: Self-pay | Admitting: Family Medicine

## 2024-05-11 ENCOUNTER — Ambulatory Visit (INDEPENDENT_AMBULATORY_CARE_PROVIDER_SITE_OTHER): Admitting: Neurology

## 2024-05-11 ENCOUNTER — Ambulatory Visit (INDEPENDENT_AMBULATORY_CARE_PROVIDER_SITE_OTHER): Payer: Self-pay | Admitting: Family Medicine

## 2024-05-11 VITALS — BP 165/115 | HR 70 | Temp 98.2°F | Ht 70.5 in | Wt 277.0 lb

## 2024-05-11 DIAGNOSIS — R7303 Prediabetes: Secondary | ICD-10-CM

## 2024-05-11 DIAGNOSIS — G4733 Obstructive sleep apnea (adult) (pediatric): Secondary | ICD-10-CM

## 2024-05-11 DIAGNOSIS — E559 Vitamin D deficiency, unspecified: Secondary | ICD-10-CM

## 2024-05-11 DIAGNOSIS — J310 Chronic rhinitis: Secondary | ICD-10-CM

## 2024-05-11 DIAGNOSIS — Z6839 Body mass index (BMI) 39.0-39.9, adult: Secondary | ICD-10-CM | POA: Diagnosis not present

## 2024-05-11 DIAGNOSIS — I10 Essential (primary) hypertension: Secondary | ICD-10-CM

## 2024-05-11 DIAGNOSIS — E66812 Obesity, class 2: Secondary | ICD-10-CM

## 2024-05-11 DIAGNOSIS — E669 Obesity, unspecified: Secondary | ICD-10-CM | POA: Diagnosis not present

## 2024-05-11 DIAGNOSIS — E6609 Other obesity due to excess calories: Secondary | ICD-10-CM

## 2024-05-11 DIAGNOSIS — R0683 Snoring: Secondary | ICD-10-CM

## 2024-05-11 DIAGNOSIS — M2629 Other anomalies of dental arch relationship: Secondary | ICD-10-CM

## 2024-05-11 MED ORDER — METFORMIN HCL 500 MG PO TABS: 500.0000 mg | ORAL_TABLET | Freq: Every day | ORAL | 0 refills | Status: DC

## 2024-05-11 MED ORDER — VITAMIN D (ERGOCALCIFEROL) 1.25 MG (50000 UNIT) PO CAPS: 50000.0000 [IU] | ORAL_CAPSULE | ORAL | 0 refills | Status: DC

## 2024-05-11 MED ORDER — LISINOPRIL 20 MG PO TABS: 20.0000 mg | ORAL_TABLET | Freq: Every day | ORAL | 0 refills | Status: DC

## 2024-05-11 NOTE — Progress Notes (Signed)
 Office: [phone]  /  Fax: [phone]  WEIGHT SUMMARY AND BIOMETRICS  Anthropometric Measurements Height: 5' 10.5 (1.791 m) Weight: 277 lb (125.6 kg) BMI (Calculated): 39.17 Weight at Last Visit: 279 lb Weight Lost Since Last Visit: 2 lb Weight Gained Since Last Visit: 0 Starting Weight: 288 lb Total Weight Loss (lbs): 11 lb (4.99 kg) Peak Weight: 295 lb Waist Measurement : 50 inches   Body Composition  Body Fat %: 32.5 % Fat Mass (lbs): 90.2 lbs Muscle Mass (lbs): 178.4 lbs Total Body Water (lbs): 130.4 lbs Visceral Fat Rating : 20   Other Clinical Data RMR: 2002 Fasting: yes Labs: no Today's Visit #: 6 Starting Date: 02/03/24 Comments: low carb    Chief Complaint: OBESITY   Discussed the use of AI scribe software for clinical note transcription with the patient, who gave verbal consent to proceed.  History of Present Illness Tyrone Freeman is a 53 year old male with obesity and hypertension who presents for obesity treatment and progress assessment.  He has been following a low-carb eating plan approximately 90% of the time, focusing on increasing vegetable intake and meeting protein requirements. He maintains adequate hydration and does not skip meals. He generally sleeps 7 to 9 hours per night and exercises twice a week for about 30 minutes, primarily using the treadmill at the gym. He has lost 2 pounds in the last two weeks.  He is currently taking lisinopril 20 mg daily.  A sleep study conducted on May 18, 2019, revealed moderate to severe obstructive sleep apnea with an AHI of 28.3. He does not regularly use a CPAP machine but has picked up a device for a repeat sleep study. He sometimes uses a mouthpiece, which he believes helps with his blood pressure, although he did not use it the previous night.      PHYSICAL EXAM:  Blood pressure (!) 165/115, pulse 70, temperature 98.2 F (36.8 C), height 5' 10.5 (1.791 m), weight 277 lb (125.6 kg),  SpO2 98%. Body mass index is 39.18 kg/m.  DIAGNOSTIC DATA REVIEWED:  BMET    Component Value Date/Time   NA 138 02/03/2024 1009   K 4.3 02/03/2024 1009   CL 102 02/03/2024 1009   CO2 22 02/03/2024 1009   GLUCOSE 86 02/03/2024 1009   GLUCOSE 104 (H) 12/23/2023 0922   BUN 14 02/03/2024 1009   CREATININE 1.46 (H) 02/03/2024 1009   CALCIUM 9.4 02/03/2024 1009   GFRNONAA 79.37 04/20/2009 1034   GFRAA 73 02/17/2008 1005   Lab Results  Component Value Date   HGBA1C 5.8 (H) 02/03/2024   Lab Results  Component Value Date   INSULIN  24.5 02/03/2024   Lab Results  Component Value Date   TSH 1.280 02/03/2024   CBC    Component Value Date/Time   WBC 6.2 12/23/2023 0922   RBC 5.40 12/23/2023 0922   HGB 15.8 12/23/2023 0922   HCT 45.8 12/23/2023 0922   PLT 229.0 12/23/2023 0922   MCV 84.8 12/23/2023 0922   MCHC 34.5 12/23/2023 0922   RDW 14.0 12/23/2023 0922   Iron Studies    Component Value Date/Time   IRON 104 08/04/2012 0916   IRONPCTSAT 30.1 08/04/2012 0916   Lipid Panel     Component Value Date/Time   CHOL 182 12/23/2023 0922   TRIG 149.0 12/23/2023 0922   HDL 47.70 12/23/2023 0922   CHOLHDL 4 12/23/2023 0922   VLDL 29.8 12/23/2023 0922   LDLCALC 104 (H) 12/23/2023 9077  Hepatic Function Panel     Component Value Date/Time   PROT 7.3 02/03/2024 1009   ALBUMIN 4.2 02/03/2024 1009   AST 22 02/03/2024 1009   ALT 23 02/03/2024 1009   ALKPHOS 76 02/03/2024 1009   BILITOT 0.8 02/03/2024 1009   BILIDIR 0.2 02/17/2008 1005      Component Value Date/Time   TSH 1.280 02/03/2024 1009   Nutritional Lab Results  Component Value Date   VD25OH 30.9 02/03/2024     Assessment and Plan Assessment & Plan Obesity Obesity managed with a low-carb diet, resulting in a 2-pound weight loss over two weeks. He adheres to the diet 90% of the time, focusing on increased vegetable and protein intake, adequate hydration, and regular exercise. Challenges include avoiding  fruit and managing hunger, which he reports is well-controlled. He plans to continue the low-carb diet through Thanksgiving, employing strategies to manage potential weight gain during the holiday. - Continue low-carb diet. - Encouraged hydration and regular exercise. - Discussed strategies for managing weight during Thanksgiving, including portion control and mindful eating and use the don't let it touch eating strategy.  Essential hypertension Hypertension with elevated blood pressure readings today (165/115 mmHg and 152/102 mmHg). He is on lisinopril 20 mg daily. Blood pressure management is complicated by untreated obstructive sleep apnea, which may contribute to elevated readings. He reports improved blood pressure readings when using a mouthpiece for sleep apnea. - Continue lisinopril 20 mg daily. - Encouraged regular use of CPAP for sleep apnea management.  Obstructive sleep apnea, moderate to severe Moderate to severe obstructive sleep apnea with an AHI of 28.3 in 2020. He reports improved blood pressure readings when using a mouthpiece for sleep apnea. - Repeat home sleep study tonight - Follow up with Dr Chalice for results and treatment - Encouraged regular use of CPAP to manage sleep apnea and its associated health impacts assuming his OSA is still present. - Continue working on weight loss to treat his OSA.  Tyrone Freeman was counseled on the importance of maintaining healthy lifestyle habits, including balanced nutrition, regular physical activity, and behavioral modifications, while taking antiobesity medication.  Patient verbalized understanding that medication is an adjunct to, not a replacement for, lifestyle changes and that the long-term success and weight maintenance depend on continued adherence to these strategies.   Tyrone Freeman was informed of the importance of frequent follow up visits to maximize his success with intensive lifestyle modifications for his obesity and obesity  related health conditions as recommended by USPSTF and CMS guidelines   [name], MD

## 2024-05-12 NOTE — Progress Notes (Signed)
 [facility] 53 year old male 03/31/1971   HOME SLEEP TEST REPORT ( by Watch PAT)   STUDY DATE:  11.12.2025 DOB:   MRN:    ORDERING CLINICIAN: Dedra Gores, MD  REFERRING CLINICIAN:  Dr [name], and PCP : Dr Amon    CLINICAL INFORMATION/HISTORY: I have the pleasure of meeting with here for new sleep consult 04/11/2024 for  a new evaluation of apnea-  he developed sinusitis, and bronchitis on CPAP.   He tried CPAP over a couple of months then quit frustrated.  He suffered an ankle injury and became less active and gained weight over the last 3.5 years.  Surgery March 2021. Current BMI  just below 40. He developed increasing BP, and he just started medication.   Medical history of  has a past medical history of Allergy, Esophageal reflux, Gastric ulcer, Heartburn, Hypogonadism male, OSA (obstructive sleep apnea), Seasonal allergies (05/23/2012), Obesity.  ENT problems: (Sinusitis) , This patient had a previous sleep study/ studies in 11/ 2020  with moderate severe OSA . This patient has used the following therapies: CPAP   Epworth sleepiness score:6 / 24 points.    FSS endorsed at 19/ 63 points.  BMI: 39.4 kg/m Neck Circumference: 18    Sleep Summary:   Total Recording Time (hours, min):  7 h 56 m      Total Sleep Time (hours, min):   7 h 24 m              Percent REM (%):    38.3% (!!)                                     Respiratory Indices:   Calculated pAHI (per AASM guideline): 32.4 /h                                  REM pAHI:   55/h                                     NREM pAHI:  18/h                            Positional AHI:   highest on the right side and in supine, lowest on the left side.    Snoring in dB :    mean volume of 44 dB, moderate to loud.                                            Oxygen Saturation Statistics:   Oxygen Saturation (%) Mean:     94%         O2 Saturation Range (%):    82 - 99%                                     O2 Saturation (minutes) <89%:    5 minutes  Pulse Rate Statistics: Pulse Mean (bpm):  64 bpm, between Pulse Range of 39 bpm and 98 bpm                 IMPRESSION:  This HST confirms the presence of  severe sleep apnea ( OSA ) with strong exacerbation during REM sleep.  Moderate -Loud snoring.  Clinically not relevant level of  hypoxia.    RECOMMENDATION: I recommend CPAP therapy due to the brady-tachycardia and strong REM sleep dependence of this overall severe OSA.  Reviewing the patient's reported experience with CPAP in the past, I feel that helping him reduce nasal airway resistance and improving congestion can make the most significant difference in PAP tolerance.  Auto CPAP or BIPAP is Plan A . A ResMed device Auto CPAP would be set between 6 and 16 cm water pressure with 3 cm EPR , heated humidification and interface to be fitted to him in supine or reclined position.    REM sleep apnea will not benefit much from a dental device nor hypoglossal nerve stimulator, and his BMI excludes him . REM sleep dependent  OSA will benefit from weight loss, though.  He falls into the category of moderate to severe sleep apnea that is FDA approved for use of Zepbound.    Any patient should be cautioned not to drive, work at heights, or operate dangerous or heavy equipment when tired or sleepy.  Review of good sleep hygiene measures is accessible to any sleep clinic patient and can be reiterated through online material- I we recommend the Guide to better Sleep   by the NIH.   Weight loss and Core Strength improvement is highly recommended for individuals with low muscle tone and/ or a BMI over 30.  Any CPAP patient should be reminded to be fully compliant with PAP therapy , (defined as using PAP therapy for more than 4 hours each night ) with the goal to improve sleep related symptoms and decrease long term cardiovascular risks. Any PAP therapy patient  should be reminded, that it may take up to 3 months to get fully used to using PAP and it may take 1-2 weeks for an established CPAP user to acclimatize to changes in pressure or mask. The earlier full compliance is achieved, the better long term compliance tends to be.   Please note that untreated obstructive sleep apnea may carry additional perioperative morbidity. Patients with significant obstructive sleep apnea should receive perioperative PAP therapy and the surgical team should be informed of the diagnosis and degree of sleep disordered breathing.  Sleep fragmentation in the presence of normal proportional sleep stages is a nonspecific findings and per se does not signify an intrinsic sleep disorder or a cause for the patient's sleep-related symptoms.  Causes include (but are not limited to) the unfamiliarity of sleeping while recorded by HST device or sleeping in a sleep lab for a full Polysomnography sleep study, but also circadian rhythm disturbances, medication side effects or an underlying mood disorder or medical problem.   The referring physician will be notified of the test results.       INTERPRETING PHYSICIAN:   Dedra Gores, MD  [facility] and [facility] Sleep Board certified by [employer] and Diplomate of the [employer]. Board certified In Neurology through the ABPN, Fellow of the [employer].

## 2024-05-22 ENCOUNTER — Ambulatory Visit: Payer: Self-pay | Admitting: Neurology

## 2024-05-22 NOTE — Procedures (Signed)
 [facility] 53 year old male 01/19/1971   HOME SLEEP TEST REPORT ( by Watch PAT)   STUDY DATE:  11.12.2025 DOB:   MRN:    ORDERING CLINICIAN: Dedra Gores, MD  REFERRING CLINICIAN:  Dr [name], and PCP : Dr Amon    CLINICAL INFORMATION/HISTORY: I have the pleasure of meeting with here for new sleep consult 04/11/2024 for  a new evaluation of apnea-  he developed sinusitis, and bronchitis on CPAP.   He tried CPAP over a couple of months then quit frustrated.  He suffered an ankle injury and became less active and gained weight over the last 3.5 years.  Surgery March 2021. Current BMI  just below 40. He developed increasing BP, and he just started medication.   Medical history of  has a past medical history of Allergy, Esophageal reflux, Gastric ulcer, Heartburn, Hypogonadism male, OSA (obstructive sleep apnea), Seasonal allergies (05/23/2012), Obesity.  ENT problems: (Sinusitis) , This patient had a previous sleep study/ studies in 11/ 2020  with moderate severe OSA . This patient has used the following therapies: CPAP   Epworth sleepiness score:6 / 24 points.    FSS endorsed at 19/ 63 points.  BMI: 39.4 kg/m Neck Circumference: 18    Sleep Summary:   Total Recording Time (hours, min):  7 h 56 m      Total Sleep Time (hours, min):   7 h 24 m              Percent REM (%):    38.3% (!!)                                     Respiratory Indices:   Calculated pAHI (per AASM guideline): 32.4 /h                                  REM pAHI:   55/h                                     NREM pAHI:  18/h                            Positional AHI:   highest on the right side and in supine, lowest on the left side.    Snoring in dB :    mean volume of 44 dB, moderate to loud.                                            Oxygen Saturation Statistics:   Oxygen Saturation (%) Mean:     94%         O2 Saturation Range (%):   82 - 99%                                      O2 Saturation (minutes) <89%:    5 minutes       Pulse Rate Statistics: Pulse Mean (bpm):  64 bpm, between Pulse Range of 39 bpm and 98 bpm                 IMPRESSION:  This HST confirms the presence of  severe sleep apnea ( OSA ) with strong exacerbation during REM sleep.  Moderate -Loud snoring.  Clinically not relevant level of  hypoxia.    RECOMMENDATION: I recommend CPAP therapy due to the brady-tachycardia and strong REM sleep dependence of this overall severe OSA.  Reviewing the patient's reported experience with CPAP in the past, I feel that helping him reduce nasal airway resistance and improving congestion can make the most significant difference in PAP tolerance.  Auto CPAP or BIPAP is Plan A . A ResMed device Auto CPAP would be set between 6 and 16 cm water pressure with 3 cm EPR , heated humidification and interface to be fitted to him in supine or reclined position.    REM sleep apnea will not benefit much from a dental device nor hypoglossal nerve stimulator, and his BMI excludes him . REM sleep dependent  OSA will benefit from weight loss, though.  He falls into the category of moderate to severe sleep apnea that is FDA approved for use of Zepbound.    Any patient should be cautioned not to drive, work at heights, or operate dangerous or heavy equipment when tired or sleepy.  Review of good sleep hygiene measures is accessible to any sleep clinic patient and can be reiterated through online material- I we recommend the Guide to better Sleep   by the NIH.   Weight loss and Core Strength improvement is highly recommended for individuals with low muscle tone and/ or a BMI over 30.  Any CPAP patient should be reminded to be fully compliant with PAP therapy , (defined as using PAP therapy for more than 4 hours each night ) with the goal to improve sleep related symptoms and decrease long term cardiovascular risks. Any PAP therapy patient should be  reminded, that it may take up to 3 months to get fully used to using PAP and it may take 1-2 weeks for an established CPAP user to acclimatize to changes in pressure or mask. The earlier full compliance is achieved, the better long term compliance tends to be.   Please note that untreated obstructive sleep apnea may carry additional perioperative morbidity. Patients with significant obstructive sleep apnea should receive perioperative PAP therapy and the surgical team should be informed of the diagnosis and degree of sleep disordered breathing.  Sleep fragmentation in the presence of normal proportional sleep stages is a nonspecific findings and per se does not signify an intrinsic sleep disorder or a cause for the patient's sleep-related symptoms.  Causes include (but are not limited to) the unfamiliarity of sleeping while recorded by HST device or sleeping in a sleep lab for a full Polysomnography sleep study, but also circadian rhythm disturbances, medication side effects or an underlying mood disorder or medical problem.   The referring physician will be notified of the test results.       INTERPRETING PHYSICIAN:   Dedra Gores, MD  [facility] and [facility] Sleep Board certified by [employer] Medicine and Diplomate of the [employer]. Board certified In Neurology through the ABPN, Fellow of the [employer].

## 2024-06-02 ENCOUNTER — Ambulatory Visit (INDEPENDENT_AMBULATORY_CARE_PROVIDER_SITE_OTHER): Admitting: Family Medicine

## 2024-06-03 ENCOUNTER — Encounter: Payer: Self-pay | Admitting: Neurology

## 2024-06-05 NOTE — Telephone Encounter (Signed)
 Should be in your [place], Sir.  RESULT :  This HST confirms the presence of  severe sleep apnea ( OSA ) with strong exacerbation during REM sleep.  Moderate -Loud snoring.  Clinically not relevant level of  hypoxia.    RECOMMENDATION: I recommend CPAP therapy due to the brady-tachycardia and strong REM sleep dependence of this overall severe OSA.  Reviewing the patient's reported experience with CPAP in the past, I feel that helping him reduce nasal airway resistance and improving congestion can make the most significant difference in PAP tolerance.   Auto CPAP or BIPAP is Plan A . A ResMed device Auto CPAP would be set between 6 and 16 cm water pressure with 3 cm EPR , heated humidification and interface to be fitted to him in supine or reclined position.     REM sleep apnea will not benefit much from a dental device nor hypoglossal nerve stimulator, and his BMI excludes him . REM sleep dependent  OSA will benefit from weight loss, though.  He falls into the category of moderate to severe sleep apnea that is FDA approved for use of Zepbound.

## 2024-06-09 ENCOUNTER — Encounter

## 2024-06-09 NOTE — Telephone Encounter (Signed)
 Alydia Gosser D, CMA  Zott, Gasper Ona, Tammy; Darrel Boyer New orders have been placed for the above pt, DOB: [birth year] Thanks

## 2024-06-12 NOTE — Progress Notes (Unsigned)
° °  SUBJECTIVE: Discussed the use of AI scribe software for clinical note transcription with the patient, who gave verbal consent to proceed.  Chief Complaint: Obesity  Interim History: ***  Tyrone Freeman is here to discuss his progress with his obesity treatment plan. He is on the {HWW Weight Loss Plan:210964005} and states he {CHL AMB IS/IS NOT:210130109} following his eating plan approximately *** % of the time. He states he {CHL AMB IS/IS NOT:210130109} exercising *** minutes *** times per week.   OBJECTIVE: Visit Diagnoses: Problem List Items Addressed This Visit     Morbid obesity (HCC)   Other Visit Diagnoses       Prediabetes    -  Primary     OSA (obstructive sleep apnea)         Vitamin D  deficiency         B12 deficiency         Primary hypertension           No data recorded No data recorded No data recorded No data recorded   ASSESSMENT AND PLAN:  Diet: Laureano {CHL AMB IS/IS NOT:210130109} currently in the action stage of change. As such, his goal is to {HWW Weight Loss Efforts:210964006}. He {HAS HAS WNU:81165} agreed to {HWW Weight Loss Plan:210964005}.  Exercise: Kyrese has been instructed {HWW Exercise:210964007} for weight loss and overall health benefits.   Behavior Modification:  We discussed the following Behavioral Modification Strategies today: {HWW Behavior Modification:210964008}. We discussed various medication options to help Adryen with his weight loss efforts and we both agreed to ***.  No follow-ups on file.SABRA He was informed of the importance of frequent follow up visits to maximize his success with intensive lifestyle modifications for his multiple health conditions.  Attestation Statements:   Reviewed by clinician on day of visit: allergies, medications, problem list, medical history, surgical history, family history, social history, and previous encounter notes.   Time spent on visit including pre-visit chart review and post-visit care  and charting was *** minutes.    Lendora Keys, PA-C

## 2024-06-13 ENCOUNTER — Ambulatory Visit (INDEPENDENT_AMBULATORY_CARE_PROVIDER_SITE_OTHER): Admitting: Physician Assistant

## 2024-06-13 ENCOUNTER — Encounter (INDEPENDENT_AMBULATORY_CARE_PROVIDER_SITE_OTHER): Payer: Self-pay

## 2024-06-13 DIAGNOSIS — E559 Vitamin D deficiency, unspecified: Secondary | ICD-10-CM

## 2024-06-13 DIAGNOSIS — E538 Deficiency of other specified B group vitamins: Secondary | ICD-10-CM

## 2024-06-13 DIAGNOSIS — R7303 Prediabetes: Secondary | ICD-10-CM

## 2024-06-13 DIAGNOSIS — I10 Essential (primary) hypertension: Secondary | ICD-10-CM

## 2024-06-13 DIAGNOSIS — G4733 Obstructive sleep apnea (adult) (pediatric): Secondary | ICD-10-CM

## 2024-06-14 ENCOUNTER — Ambulatory Visit (INDEPENDENT_AMBULATORY_CARE_PROVIDER_SITE_OTHER): Admitting: Nurse Practitioner

## 2024-06-14 ENCOUNTER — Encounter (INDEPENDENT_AMBULATORY_CARE_PROVIDER_SITE_OTHER): Payer: Self-pay | Admitting: Nurse Practitioner

## 2024-06-14 VITALS — BP 168/98 | HR 78 | Temp 98.1°F | Ht 70.5 in | Wt 280.0 lb

## 2024-06-14 DIAGNOSIS — I10 Essential (primary) hypertension: Secondary | ICD-10-CM

## 2024-06-14 DIAGNOSIS — E88819 Insulin resistance, unspecified: Secondary | ICD-10-CM

## 2024-06-14 DIAGNOSIS — E538 Deficiency of other specified B group vitamins: Secondary | ICD-10-CM

## 2024-06-14 DIAGNOSIS — G4733 Obstructive sleep apnea (adult) (pediatric): Secondary | ICD-10-CM

## 2024-06-14 DIAGNOSIS — Z6839 Body mass index (BMI) 39.0-39.9, adult: Secondary | ICD-10-CM

## 2024-06-14 DIAGNOSIS — R7303 Prediabetes: Secondary | ICD-10-CM

## 2024-06-14 DIAGNOSIS — E559 Vitamin D deficiency, unspecified: Secondary | ICD-10-CM

## 2024-06-14 MED ORDER — VITAMIN D (ERGOCALCIFEROL) 1.25 MG (50000 UNIT) PO CAPS: 50000.0000 [IU] | ORAL_CAPSULE | ORAL | 0 refills | Status: DC

## 2024-06-14 MED ORDER — OLMESARTAN MEDOXOMIL 20 MG PO TABS: 20.0000 mg | ORAL_TABLET | Freq: Every day | ORAL | 0 refills | Status: DC

## 2024-06-14 NOTE — Progress Notes (Signed)
 Office: [phone]  /  Fax: [phone]  WEIGHT SUMMARY AND BIOMETRICS  Weight Lost Since Last Visit: 0  Weight Gained Since Last Visit: 3 lb   Vitals Temp: 98.1 F (36.7 C) BP: (!) 168/98 Pulse Rate: 78 SpO2: 97 %   Anthropometric Measurements Height: 5' 10.5 (1.791 m) Weight: 280 lb (127 kg) BMI (Calculated): 39.59 Weight at Last Visit: 277 lb Weight Lost Since Last Visit: 0 Weight Gained Since Last Visit: 3 lb Starting Weight: 288 lb Total Weight Loss (lbs): 8 lb (3.629 kg) Peak Weight: 295 lb Waist Measurement : 50 inches   Body Composition  Body Fat %: 32.6 % Fat Mass (lbs): 91.4 lbs Muscle Mass (lbs): 179.6 lbs Total Body Water (lbs): 131.8 lbs Visceral Fat Rating : 20   Other Clinical Data Fasting: yes Labs: no Today's Visit #: 8 Starting Date: 02/03/24    Total Weight Loss: 8 pounds(gained 3 pounds this visit)  Bio Impedance Data reviewed with patient: muscle is up 1.2 pounds, adipose is up 1.2 pounds.   HPI  Chief Complaint: OBESITY  Jodey is here to discuss his progress with his obesity treatment plan. He is on the following a lower carbohydrate, vegetable and lean protein rich diet plan and states he is following his eating plan approximately 90 % of the time. He states he is not currently exercising   Interval History:  Since last office visit he did not overindulge for Thanksgiving.  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  He is getting about 100 grams of protein daily. He is getting about 32 ounces of water daily. No normal sweet drinks but has been drinking a little sweet tea. He has skipped an occasional meal.   He normally likes to treadmill and free weights but has been unable due to schedule.   Estill does have hypertension and his BP's were not controlled, started on lisinopril  20 mg every day at last visit.  Denies headaches, chest pain shortness of breath at rest and dizziness  His BP's at home have been 130's/80's, he is doing low salt BP Readings from Last 3 Encounters:  06/14/24 (!) 168/98  05/11/24 (!) 165/115  04/27/24 (!) 156/113   He is on Ergocalciferol  50000 units once a week for Vit D deficiency. Denies side effects Last vitamin D  Lab Results  Component Value Date   VD25OH 30.9 02/03/2024    He continues on Metformin  500 mg every day for prediabetes and insulin  resistance.  Denies side effects from medication.   Sleep study 05/11/2024- AHI 32.4, O2 sleep nadir 82%- severe sleep apnea. Recommended CPAP or BiPAP- also qualifies for FDA approval for Zepbound due to severe sleep apnea. He is being ordered a CPAP and following with Dr. Marnie.   PHYSICAL EXAM:  Blood pressure (!) 168/98, pulse 78, temperature 98.1 F (36.7 C), height 5' 10.5 (1.791 m), weight 280 lb (127 kg), SpO2 97%. Body mass index is 39.61 kg/m.  General: Well Developed, well nourished, and in no acute distress.  HEENT: Normocephalic, atraumatic; EOMI, sclerae are anicteric. Skin: Warm and dry, good turgor Chest:  Normal excursion, shape, no gross ABN Respiratory: No conversational dyspnea; speaking in full sentences NeuroM-Sk:  Normal gross ROM * 4 extremities  Psych: A and O X 3, insight adequate, mood- full    DIAGNOSTIC DATA REVIEWED:  BMET    Component Value Date/Time  NA 138 02/03/2024 1009   K 4.3 02/03/2024 1009   CL 102 02/03/2024 1009   CO2 22 02/03/2024 1009   GLUCOSE 86 02/03/2024 1009   GLUCOSE 104 (H) 12/23/2023 0922   BUN 14 02/03/2024 1009   CREATININE 1.46 (H) 02/03/2024 1009   CALCIUM 9.4 02/03/2024 1009   GFRNONAA 79.37 04/20/2009 1034   GFRAA 73 02/17/2008 1005   Lab Results  Component Value Date   HGBA1C 5.8 (H) 02/03/2024   Lab Results  Component Value Date   INSULIN  24.5 02/03/2024   Lab Results  Component Value Date   TSH 1.280 02/03/2024   CBC     Component Value Date/Time   WBC 6.2 12/23/2023 0922   RBC 5.40 12/23/2023 0922   HGB 15.8 12/23/2023 0922   HCT 45.8 12/23/2023 0922   PLT 229.0 12/23/2023 0922   MCV 84.8 12/23/2023 0922   MCHC 34.5 12/23/2023 0922   RDW 14.0 12/23/2023 0922   Iron Studies    Component Value Date/Time   IRON 104 08/04/2012 0916   IRONPCTSAT 30.1 08/04/2012 0916   Lipid Panel     Component Value Date/Time   CHOL 182 12/23/2023 0922   TRIG 149.0 12/23/2023 0922   HDL 47.70 12/23/2023 0922   CHOLHDL 4 12/23/2023 0922   VLDL 29.8 12/23/2023 0922   LDLCALC 104 (H) 12/23/2023 0922   Hepatic Function Panel     Component Value Date/Time   PROT 7.3 02/03/2024 1009   ALBUMIN 4.2 02/03/2024 1009   AST 22 02/03/2024 1009   ALT 23 02/03/2024 1009   ALKPHOS 76 02/03/2024 1009   BILITOT 0.8 02/03/2024 1009   BILIDIR 0.2 02/17/2008 1005      Component Value Date/Time   TSH 1.280 02/03/2024 1009   Nutritional Lab Results  Component Value Date   VD25OH 30.9 02/03/2024     ASSESSMENT AND PLAN  Class 2 severe obesity with serious comorbidity and body mass index (BMI) of 39.0 to 39.9 in adult, unspecified obesity type  TREATMENT PLAN FOR OBESITY:  Recommended Dietary Goals  Demarlo is currently in the action stage of change. As such, his goal is to continue weight management plan. He has agreed to the Category 3 Plan and following a lower carbohydrate, vegetable and lean protein rich diet plan.  Behavioral Intervention  We discussed the following Behavioral Modification Strategies today: avoiding skipping meals, increasing water intake , celebration eating strategies, continue to work on maintaining a reduced calorie state, getting the recommended amount of protein, incorporating whole foods, making healthy choices, staying well hydrated and practicing mindfulness when eating., and increase protein intake, fibrous foods (25 grams per day for women, 30 grams for men) and water to improve  satiety and decrease hunger signals. SABRA  He wants to lose weight in December - He does  recognize that he must follow a structured plan and will eat before social situation to meet his protein goals and minimze party foods - He will give away food gifts unless they are very low calories or high protein - He will avoid calorie-containing liquids such as holiday drinks, eggnog and alcohol - He will stick to her structured plan strictly most of the time - This strategy should help him lose 1-2 pounds in December   Recommended Physical Activity Goals  Deklan has been advised to work up to 150 minutes of moderate intensity aerobic activity a week and strengthening exercises 2-3 times per week for cardiovascular health, weight loss maintenance and preservation of  muscle mass.   He has agreed to Increase physical activity in their day and reduce sedentary time (increase NEAT)., Start aerobic activity with a goal of 150 minutes a week at moderate intensity. , and Combine aerobic and strengthening exercises for efficiency and improved cardiometabolic health.   Pharmacotherapy We discussed various medication options to help Pancho with his weight loss efforts and we both agreed to stop Lisinopril  20 mg and start Olmesartan  20 mg at bedtime- monitor BP and keep log to bring to next visit.  ASSOCIATED CONDITIONS ADDRESSED TODAY  Action/Plan  Essential hypertension Stop Lisinopril  20 mg every day and start Olmesartan  20 mg QHS Continue Category 3 meal plan  and DASH diet Monitor BP and if consistently >140/90 notify PCP, bring BP log to next visit If develops headaches, chest pain, shortness of breath or dizziness go to ER Continue to follow regularly with PCP -     Olmesartan  Medoxomil; Take 1 tablet (20 mg total) by mouth daily.  Dispense: 30 tablet; Refill: 0 -     Basic metabolic panel with GFR  Prediabetes Continue Category 3  meal plan, limit simple carbohydrates Decreasing body weight by  10-15% can improve glucose levels Continue exercise with current goal of 150 minutes of moderate to high intensity exercise/week.  Continue Metformin  500 mg every day- states he has not yet picked up last refill, does not need refill currently -     Basic metabolic panel with GFR  Insulin  resistance       Continue Category 3  meal plan, limit simple carbohydrates Decreasing body weight by 10-15% can improve glucose levels Continue exercise with current goal of 150 minutes of moderate to high intensity exercise/week.  Continue Metformin  500 mg every day- states he has not yet picked up last refill, does not need refill currently -     Basic metabolic panel with GFR  OSA (obstructive sleep apnea)     Is being Ordered CPAP and encouraged to use 100% of the time- follow with Dr. Chalice      Loss of 10-15% body weight can help improve OSA   Vitamin D  deficiency Continue to supplement with Ergocalciferol  50000 units once a week, rechecking level today  -     Vitamin D  (Ergocalciferol ); Take 1 capsule (50,000 Units total) by mouth every 7 (seven) days.  Dispense: 4 capsule; Refill: 0 -     VITAMIN D  25 Hydroxy (Vit-D Deficiency, Fractures)  B12 deficiency Continue cyanocobalamin 1000 mcg daily, recheck level today -     Vitamin B12         Return in about 4 weeks (around 07/12/2024).SABRA He was informed of the importance of frequent follow up visits to maximize his success with intensive lifestyle modifications for his multiple health conditions.   ATTESTASTION STATEMENTS:  Reviewed by clinician on day of visit: allergies, medications, problem list, medical history, surgical history, family history, social history, and previous encounter notes.   I personally spent a total of 40 minutes in the care of the patient today including preparing to see the patient, getting/reviewing separately obtained history, performing a medically appropriate exam/evaluation, counseling and educating, placing  orders, and documenting clinical information in the EHR.   Anakin Varkey ANP-C

## 2024-06-15 ENCOUNTER — Encounter (INDEPENDENT_AMBULATORY_CARE_PROVIDER_SITE_OTHER): Payer: Self-pay | Admitting: Nurse Practitioner

## 2024-06-15 ENCOUNTER — Ambulatory Visit: Payer: Self-pay | Admitting: Nurse Practitioner

## 2024-06-15 LAB — BASIC METABOLIC PANEL WITH GFR
BUN/Creatinine Ratio: 10 (ref 9–20)
BUN: 14 mg/dL (ref 6–24)
CO2: 23 mmol/L (ref 20–29)
Calcium: 9.6 mg/dL (ref 8.7–10.2)
Chloride: 100 mmol/L (ref 96–106)
Creatinine, Ser: 1.47 mg/dL — ABNORMAL HIGH (ref 0.76–1.27)
Glucose: 84 mg/dL (ref 70–99)
Potassium: 4.3 mmol/L (ref 3.5–5.2)
Sodium: 138 mmol/L (ref 134–144)
eGFR: 57 mL/min/1.73 — ABNORMAL LOW (ref >59)

## 2024-06-15 LAB — VITAMIN B12: Vitamin B-12: 575 pg/mL (ref 232–1245)

## 2024-06-15 LAB — VITAMIN D 25 HYDROXY (VIT D DEFICIENCY, FRACTURES): Vit D, 25-Hydroxy: 48.8 ng/mL (ref 30.0–100.0)

## 2024-07-06 ENCOUNTER — Other Ambulatory Visit (INDEPENDENT_AMBULATORY_CARE_PROVIDER_SITE_OTHER): Payer: Self-pay | Admitting: Nurse Practitioner

## 2024-07-06 DIAGNOSIS — I10 Essential (primary) hypertension: Secondary | ICD-10-CM

## 2024-07-12 ENCOUNTER — Other Ambulatory Visit (INDEPENDENT_AMBULATORY_CARE_PROVIDER_SITE_OTHER): Payer: Self-pay | Admitting: Nurse Practitioner

## 2024-07-12 ENCOUNTER — Ambulatory Visit (INDEPENDENT_AMBULATORY_CARE_PROVIDER_SITE_OTHER): Admitting: Family Medicine

## 2024-07-12 ENCOUNTER — Other Ambulatory Visit (INDEPENDENT_AMBULATORY_CARE_PROVIDER_SITE_OTHER): Payer: Self-pay | Admitting: Family Medicine

## 2024-07-12 DIAGNOSIS — I10 Essential (primary) hypertension: Secondary | ICD-10-CM

## 2024-07-12 DIAGNOSIS — R7303 Prediabetes: Secondary | ICD-10-CM

## 2024-07-19 ENCOUNTER — Telehealth: Payer: Self-pay | Admitting: Neurology

## 2024-07-19 NOTE — Progress Notes (Unsigned)
 SABRA

## 2024-07-19 NOTE — Telephone Encounter (Signed)
 Pt was scheduled for his initial CPAP on (07-20-24) Pt was informed to bring machine and power cord to the appointment.   between dates are :07/17/24-09/15/24 pt's DME is in [employer].

## 2024-07-20 ENCOUNTER — Encounter (INDEPENDENT_AMBULATORY_CARE_PROVIDER_SITE_OTHER): Payer: Self-pay | Admitting: Family Medicine

## 2024-07-20 ENCOUNTER — Encounter: Payer: Self-pay | Admitting: Neurology

## 2024-07-20 ENCOUNTER — Ambulatory Visit: Admitting: Neurology

## 2024-07-20 ENCOUNTER — Telehealth (INDEPENDENT_AMBULATORY_CARE_PROVIDER_SITE_OTHER): Admitting: Family Medicine

## 2024-07-20 VITALS — Ht 70.5 in | Wt 280.0 lb

## 2024-07-20 VITALS — BP 140/100 | HR 79 | Ht 71.0 in | Wt 291.0 lb

## 2024-07-20 DIAGNOSIS — Z6839 Body mass index (BMI) 39.0-39.9, adult: Secondary | ICD-10-CM

## 2024-07-20 DIAGNOSIS — E559 Vitamin D deficiency, unspecified: Secondary | ICD-10-CM

## 2024-07-20 DIAGNOSIS — R7303 Prediabetes: Secondary | ICD-10-CM | POA: Diagnosis not present

## 2024-07-20 DIAGNOSIS — G4733 Obstructive sleep apnea (adult) (pediatric): Secondary | ICD-10-CM

## 2024-07-20 DIAGNOSIS — I10 Essential (primary) hypertension: Secondary | ICD-10-CM | POA: Diagnosis not present

## 2024-07-20 MED ORDER — VITAMIN D (ERGOCALCIFEROL) 1.25 MG (50000 UNIT) PO CAPS: 50000.0000 [IU] | ORAL_CAPSULE | ORAL | 0 refills | Status: AC

## 2024-07-20 MED ORDER — METFORMIN HCL 500 MG PO TABS: 500.0000 mg | ORAL_TABLET | Freq: Every day | ORAL | 0 refills | Status: AC

## 2024-07-20 MED ORDER — OLMESARTAN MEDOXOMIL 20 MG PO TABS: 20.0000 mg | ORAL_TABLET | Freq: Every day | ORAL | 0 refills | Status: AC

## 2024-07-20 NOTE — Progress Notes (Signed)
 "  Patient: Tyrone Freeman Date of Birth: 27-Nov-1970  Reason for Visit: Follow up History from: Patient Primary Neurologist: Dohmeier  ASSESSMENT AND PLAN 54 y.o. year old male   1.  OSA on CPAP  - HST 05/11/2024 severe sleep apnea  - CPAP set up 06/17/24 - Excellent CPAP compliance and benefit! - Continue nightly CPAP use minimum 4 hours - Significant AHI improvement, pAHI in HST 32.4, with CPAP 1.6 - Continue current settings - Is pursuing weight loss with diet and exercise - Follow-up in 1 year virtually  HISTORY OF PRESENT ILLNESS: Today 07/20/24 HST 05/11/2024 severe sleep apnea with strong exacerbation during REM sleep.  Recommended CPAP due to bradycardia tachycardia and strong REM sleep and dependence of this overall severe sleep apnea.  pAHI 32.4/H.  CPAP report shows 93% compliance, greater than 4 hours 83%, 6-16 cmH2O, AHI 1.6, leak 39.4.  Using fullface mask under the nose.  Has noted some mask leak, but not bothersome, now has a beard, likely contributing.  Significant benefit with CPAP, sleep is more restorative, has more energy and less fatigue.  Is very pleased and motivated to continue CPAP use.  He is no longer snoring, his wife is pleased.  He is continuing to work on weight loss with diet and exercise.  HISTORY  HPI: I have the pleasure of meeting with here for new sleep consult 04/11/2024 for  a new evaluation of apnea-  he developed sinusitis, and bronchitis on CPAP.   He tried CPAP over a couple of months then quit frustrated.  He suffered an ankle injury and became less active and gained weight over the last 3.5 years.  Surgery March 2021. Current BMI  just below 40. He developed increasing BP, and he just started medication.    Chief concern according to patient:      Medical history of  has a past medical history of Allergy, Esophageal reflux, Gastric ulcer, Heartburn, Hypogonadism male, OSA (obstructive sleep apnea), Seasonal allergies (05/23/2012), and  Sleep apnea.       Sleep relevant medical/ surgical and symptom history:      ENT surgery or problems: (Sinusitis) , Obesity.' This patient had a previous sleep study/ studies in 11/ 2020  with moderate severe OSA . 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 He  lives in a private home, in a household with spouse and one dog  pets, Daughter has moved back home  23, Nicotine use: /.  ETOH use: /,  Caffeine intake in form of: Coffee (barely ), Soft drinks (/), Tea ( /) or Energy drinks ( including those containing  taurine ). Caffeine is last consumed at AM .  Exercises regularly in form of 3 days a week 30-40 minutes.  Volunteering, (  active member of a club, church or other community memberships and engagements).       Sleep habits and routines are as follows: The patient's dinner time is around 6-7 PM.  The patient goes to bed at, or close to, 11-12 PM. The bedroom is shared with spouse  and is described as cool, quiet, and dark.  Spouse has observed apneas and snoring.  The patient reports that it takes < 10 minutes to fall asleep, then continues to sleep for 7 hours,  uninterrupted or woken up by the dog, the need to void (Nocturia).   The preferred position is  side sleep ,  with support of 1-2 pillows, ( adjustable bed/ wedge ).  Mouth guard helped too. Mandibular device.  The total estimated sleep time is circa 7 hours.  Dreams are reportedly rare/ frequent/ and can be vivid.  Dream enactment has not been reported.   6. 15  AM is the usual week- day rise time. The patient wakes up spontaneously/  He reports mostly/ feeling refreshed and restored in the morning, waking with symptoms such as dry mouth, no morning headaches, no stiffness or pain, and fatigue.  No sleep paralysis has  been experienced.  Naps in daytime are taken rarely / infrequently (there is a desire to nap and not much opportunity), lasting from 15 to 30 and have a refreshing quality.  These do not interfere with nocturnal sleep.   REVIEW OF SYSTEMS: Out of a complete 14 system review of symptoms, the patient complains only of the following symptoms, and all other reviewed systems are negative.  See HPI  ALLERGIES: Allergies[1]  HOME MEDICATIONS: Outpatient Medications Prior to Visit  Medication Sig Dispense Refill   cyanocobalamin (VITAMIN B12) 1000 MCG tablet Take 1 tablet (1,000 mcg total) by mouth daily. 30 tablet 0   metFORMIN  (GLUCOPHAGE ) 500 MG tablet Take 1 tablet (500 mg total) by mouth daily with breakfast. 90 tablet 0   olmesartan  (BENICAR ) 20 MG tablet Take 1 tablet (20 mg total) by mouth daily. 90 tablet 0   pantoprazole  (PROTONIX ) 40 MG tablet Take 1 tablet (40 mg total) by mouth daily. 90 tablet 1   sildenafil  (REVATIO ) 20 MG tablet TAKE 3-4 TABLETS (60-80 MG TOTAL) BY MOUTH DAILY AS NEEDED. 30 tablet 5   Vitamin D , Ergocalciferol , (DRISDOL ) 1.25 MG (50000 UNIT) CAPS capsule Take 1 capsule (50,000 Units total) by mouth every 7 (seven) days. 12 capsule 0   mometasone  (NASONEX ) 50 MCG/ACT nasal spray Place 2 sprays into the nose daily. (Patient not taking: Reported on 07/20/2024) 1 each 12   No facility-administered medications prior to visit.    PAST MEDICAL HISTORY: Past Medical History:  Diagnosis Date   Allergy    Esophageal reflux    Gastric ulcer    Heartburn    Hypogonadism male    OSA (obstructive sleep apnea)    Seasonal allergies 05/23/2012   Sleep apnea    does not wear cpap    PAST SURGICAL HISTORY: Past Surgical History:  Procedure Laterality Date   ORIF ANKLE FRACTURE Left 09/08/2020   UPPER GASTROINTESTINAL ENDOSCOPY     WISDOM TOOTH EXTRACTION      FAMILY HISTORY: Family History  Problem Relation Age of Onset   Colon polyps Mother    Prostate cancer  Maternal Grandfather 1   CAD Neg Hx    Diabetes Neg Hx    Colon cancer Neg Hx    Esophageal cancer Neg Hx    Rectal cancer Neg Hx    Stomach cancer Neg Hx    Sleep apnea Neg Hx        not that he is aware of     SOCIAL HISTORY: Social History   Socioeconomic History   Marital status: Married    Spouse name: Not on file   Number of children: 1   Years of education: Not on file   Highest education level: Doctorate  Occupational History   Occupation: Contractor, [place]  Tobacco Use  Smoking status: Never   Smokeless tobacco: Never  Vaping Use   Vaping status: Never Used  Substance and Sexual Activity   Alcohol use: Not Currently    Alcohol/week: 1.0 standard drink of alcohol    Types: 1 Shots of liquor per week    Comment: rarely    Drug use: No   Sexual activity: Not on file  Other Topics Concern   Not on file  Social History Narrative   Household-- pt, wife , daughter  2002, graduated from college , lives w/ them      Right handed   Caffeine: no coffee    Social Drivers of Health   Tobacco Use: Low Risk (07/20/2024)   Patient History    Smoking Tobacco Use: Never    Smokeless Tobacco Use: Never    Passive Exposure: Not on file  Financial Resource Strain: Low Risk (12/22/2023)   Overall Financial Resource Strain (CARDIA)    Difficulty of Paying Living Expenses: Not hard at all  Food Insecurity: No Food Insecurity (12/22/2023)   Epic    Worried About Radiation Protection Practitioner of Food in the Last Year: Never true    Ran Out of Food in the Last Year: Never true  Transportation Needs: No Transportation Needs (12/22/2023)   Epic    Lack of Transportation (Medical): No    Lack of Transportation (Non-Medical): No  Physical Activity: Insufficiently Active (12/22/2023)   Exercise Vital Sign    Days of Exercise per Week: 2 days    Minutes of Exercise per Session: 40 min  Stress: No Stress Concern Present (12/22/2023)   [employer] of Occupational Health -  Occupational Stress Questionnaire    Feeling of Stress: Only a little  Social Connections: Unknown (12/22/2023)   Social Connection and Isolation Panel    Frequency of Communication with Friends and Family: More than three times a week    Frequency of Social Gatherings with Friends and Family: Twice a week    Attends Religious Services: More than 4 times per year    Active Member of Clubs or Organizations: Yes    Attends Banker Meetings: More than 4 times per year    Marital Status: Not on file  Intimate Partner Violence: Not on file  Depression (PHQ2-9): Low Risk (02/03/2024)   Depression (PHQ2-9)    PHQ-2 Score: 2  Alcohol Screen: Low Risk (12/22/2023)   Alcohol Screen    Last Alcohol Screening Score (AUDIT): 1  Housing: Low Risk (12/22/2023)   Epic    Unable to Pay for Housing in the Last Year: No    Number of Times Moved in the Last Year: 0    Homeless in the Last Year: No  Utilities: Not on file  Health Literacy: Not on file   PHYSICAL EXAM  Vitals:   07/20/24 1512 07/20/24 1516  BP: (!) 157/100 (!) 140/100  Pulse: 79   SpO2: 95%   Weight: 291 lb (132 kg)   Height: 5' 11 (1.803 m)    Body mass index is 40.59 kg/m.  Generalized: Well developed, in no acute distress  Neurological examination  Mentation: Alert oriented to time, place, history taking. Follows all commands speech and language fluent Cranial nerve II-XII: Pupils were equal round reactive to light.  Motor: Moves all extremities independent Gait and station: Gait is normal.    DIAGNOSTIC DATA (LABS, IMAGING, TESTING) - I reviewed patient records, labs, notes, testing and imaging myself where available.  Lab Results  Component Value  Date   WBC 6.2 12/23/2023   HGB 15.8 12/23/2023   HCT 45.8 12/23/2023   MCV 84.8 12/23/2023   PLT 229.0 12/23/2023      Component Value Date/Time   NA 138 06/14/2024 1239   K 4.3 06/14/2024 1239   CL 100 06/14/2024 1239   CO2 23 06/14/2024 1239   GLUCOSE  84 06/14/2024 1239   GLUCOSE 104 (H) 12/23/2023 0922   BUN 14 06/14/2024 1239   CREATININE 1.47 (H) 06/14/2024 1239   CALCIUM 9.6 06/14/2024 1239   PROT 7.3 02/03/2024 1009   ALBUMIN 4.2 02/03/2024 1009   AST 22 02/03/2024 1009   ALT 23 02/03/2024 1009   ALKPHOS 76 02/03/2024 1009   BILITOT 0.8 02/03/2024 1009   GFRNONAA 79.37 04/20/2009 1034   GFRAA 73 02/17/2008 1005   Lab Results  Component Value Date   CHOL 182 12/23/2023   HDL 47.70 12/23/2023   LDLCALC 104 (H) 12/23/2023   TRIG 149.0 12/23/2023   CHOLHDL 4 12/23/2023   Lab Results  Component Value Date   HGBA1C 5.8 (H) 02/03/2024   Lab Results  Component Value Date   VITAMINB12 575 06/14/2024   Lab Results  Component Value Date   TSH 1.280 02/03/2024    Lauraine Born, AGNP-C, DNP 07/20/2024, 3:50 PM [facility] [address] [place], [place] [address] [phone]      [1]  Allergies Allergen Reactions   Hydrocodone     Nausea, no rash or itching   "

## 2024-07-20 NOTE — Patient Instructions (Signed)
 Great to see you today! Continue CPAP usage minimum 4 hours nightly Continue current settings Continue to replace supplies routinely through DME Follow-up in 1 year or sooner if needed. Thanks!!

## 2024-07-20 NOTE — Progress Notes (Signed)
 "  Office: [phone]  /  Fax: [phone]  WEIGHT SUMMARY AND BIOMETRICS  Anthropometric Measurements Height: 5' 10.5 (1.791 m) Weight: 280 lb (127 kg) BMI (Calculated): 39.59 Weight at Last Visit: 280 lb Weight Lost Since Last Visit: 0 Weight Gained Since Last Visit: 0 Starting Weight: 288 lb Total Weight Loss (lbs): 8 lb (3.629 kg) Peak Weight: 295 lb Waist Measurement : 50 inches   No data recorded Other Clinical Data Fasting: no Labs: no Today's Visit #: 9 Starting Date: 02/03/24    Chief Complaint: OBESITY  Virtual Visit via A/V Note  I connected with Tyrone Freeman English on 07/20/24 at 11:40 AM EST by audiovisual telehealth and verified that I am speaking with the correct person using two identifiers.  Location: Patient: work Provider: home   I discussed the limitations, risks, security and privacy concerns of performing an evaluation and management service by AV telehealth and the availability of in person appointments. I also discussed with the patient that there may be a patient responsible charge related to this service. The patient expressed understanding and agreed to proceed.    History of Present Illness Tyrone Freeman is a 54 year old male with obesity, hypertension, and prediabetes who presents for obesity treatment.  He is adhering to a category three eating plan for obesity treatment 90% of the time. He does not weigh himself at home but feels he has lost weight, even during the holidays. He exercises three times a week for about 20 to 40 minutes, engaging in activities such as walking, treadmill, weights, and using exercise stations at a local park. He focuses on eating more whole foods, consuming the recommended amount of protein, hydrating adequately, and not skipping meals.  He is being treated for hypertension with Benicar  20 mg daily. Historically, he has had elevated blood pressure readings in the office. At home, his blood pressure readings  fluctuate between 120/80 and 140/87, depending on his diet. He is not on any other antihypertensives and is working on improving his blood pressure through diet, exercise, and weight loss.  He has a vitamin D  deficiency, which is being treated with prescription ergocalciferol , 50,000 IU per week. He requests a refill of his prescription ergocalciferol , 50,000 IU per week.  He is managing prediabetes primarily through diet, focusing on lowering simple carbohydrates and increasing exercise, along with metformin  500 mg daily, which he takes with a meal. He requests a refill of this medication.  He has been using a CPAP machine since June 17, 2024, which has improved his quality of rest and possibly his blood pressure. He uses a full mask that covers his nose and mouth, with the hose going to the top of his head. Recently, he has experienced some whistling and leaking noises, possibly due to a worn seal or his longer beard, and has requested a replacement seal.      PHYSICAL EXAM:  Height 5' 10.5 (1.791 m), weight 280 lb (127 kg). Body mass index is 39.61 kg/m.  DIAGNOSTIC DATA REVIEWED BY MYSELF TODAY:  BMET    Component Value Date/Time   NA 138 06/14/2024 1239   K 4.3 06/14/2024 1239   CL 100 06/14/2024 1239   CO2 23 06/14/2024 1239   GLUCOSE 84 06/14/2024 1239   GLUCOSE 104 (H) 12/23/2023 0922   BUN 14 06/14/2024 1239   CREATININE 1.47 (H) 06/14/2024 1239   CALCIUM 9.6 06/14/2024 1239   GFRNONAA 79.37 04/20/2009 1034   GFRAA 73 02/17/2008 1005  Lab Results  Component Value Date   HGBA1C 5.8 (H) 02/03/2024   Lab Results  Component Value Date   INSULIN  24.5 02/03/2024   Lab Results  Component Value Date   TSH 1.280 02/03/2024   CBC    Component Value Date/Time   WBC 6.2 12/23/2023 0922   RBC 5.40 12/23/2023 0922   HGB 15.8 12/23/2023 0922   HCT 45.8 12/23/2023 0922   PLT 229.0 12/23/2023 0922   MCV 84.8 12/23/2023 0922   MCHC 34.5 12/23/2023 0922   RDW 14.0  12/23/2023 0922   Iron Studies    Component Value Date/Time   IRON 104 08/04/2012 0916   IRONPCTSAT 30.1 08/04/2012 0916   Lipid Panel     Component Value Date/Time   CHOL 182 12/23/2023 0922   TRIG 149.0 12/23/2023 0922   HDL 47.70 12/23/2023 0922   CHOLHDL 4 12/23/2023 0922   VLDL 29.8 12/23/2023 0922   LDLCALC 104 (H) 12/23/2023 0922   Hepatic Function Panel     Component Value Date/Time   PROT 7.3 02/03/2024 1009   ALBUMIN 4.2 02/03/2024 1009   AST 22 02/03/2024 1009   ALT 23 02/03/2024 1009   ALKPHOS 76 02/03/2024 1009   BILITOT 0.8 02/03/2024 1009   BILIDIR 0.2 02/17/2008 1005      Component Value Date/Time   TSH 1.280 02/03/2024 1009   Nutritional Lab Results  Component Value Date   VD25OH 48.8 06/14/2024   VD25OH 30.9 02/03/2024     Assessment and Plan Assessment & Plan Morbid obesity Actively managed with a category three eating plan, which he adheres to 90% of the time. He reports weight loss over the holidays and is engaging in regular exercise, including walking, treadmill, and weight training. He is working on increasing whole foods and protein intake while maintaining adequate hydration and not skipping meals. - Continue category three eating plan - Continue exercise regimen three times a week for 20 minutes - Encouraged weekly weigh-ins starting next month  Essential hypertension Managed with Benicar  20 mg daily. Home blood pressure readings range from 120/87 to 140/unknown, with occasional higher readings. He is also using a CPAP machine, which may contribute to improved blood pressure control. - Continue Benicar  20 mg daily - Refilled Benicar  prescription for 90 days  Prediabetes Managed with dietary modifications, particularly reducing simple carbohydrates, and increased exercise. He is also on metformin  500 mg daily, which he requests a refill for. - Continue metformin  500 mg daily - Refilled metformin  prescription for 90 days  Vitamin D   deficiency Managed with prescription ergocalciferol  50,000 IU weekly. He is stable on this regimen and requests a refill. - Continue ergocalciferol  50,000 IU weekly - Refilled ergocalciferol  prescription  Obstructive sleep apnea Managed with a CPAP machine since December 19th. He reports improved rest quality and potential blood pressure benefits. Recently experienced mask seal issues, likely due to facial hair growth, and has requested a replacement seal. - Replace CPAP mask seal - Continue CPAP therapy - Monitor for potential resolution of sleep apnea with continued weight loss      Patients who are on anti-obesity medications are counseled on the importance of maintaining healthy lifestyle habits, including balanced nutrition, regular physical activity, and behavioral modifications,  Medication is an adjunct to, not a replacement for, lifestyle changes and that the long-term success and weight maintenance depend on continued adherence to these strategies.   Margie was informed of the importance of frequent follow up visits to maximize his success with intensive  lifestyle modifications for his obesity and obesity related health conditions as recommended by USPSTF and CMS guidelines  [name], MD   "

## 2024-07-25 ENCOUNTER — Ambulatory Visit (INDEPENDENT_AMBULATORY_CARE_PROVIDER_SITE_OTHER): Admitting: Family Medicine

## 2024-12-23 ENCOUNTER — Encounter: Admitting: Internal Medicine

## 2025-07-26 ENCOUNTER — Telehealth: Admitting: Neurology
